# Patient Record
Sex: Female | Born: 1965 | Race: White | Hispanic: No | Marital: Married | Smoking: Never smoker
Health system: Southern US, Community
[De-identification: ages and names within clinical notes are randomized; demographics above are authoritative.]

## PROBLEM LIST (undated history)

## (undated) DIAGNOSIS — N63 Unspecified lump in unspecified breast: Secondary | ICD-10-CM

## (undated) DIAGNOSIS — T7840XA Allergy, unspecified, initial encounter: Secondary | ICD-10-CM

## (undated) DIAGNOSIS — J45909 Unspecified asthma, uncomplicated: Secondary | ICD-10-CM

## (undated) HISTORY — DX: Allergy, unspecified, initial encounter: T78.40XA

## (undated) HISTORY — DX: Unspecified asthma, uncomplicated: J45.909

---

## 1995-05-20 ENCOUNTER — Encounter: Payer: Self-pay | Admitting: Gastroenterology

## 1995-05-30 ENCOUNTER — Encounter: Payer: Self-pay | Admitting: Gastroenterology

## 1996-03-01 ENCOUNTER — Encounter: Payer: Self-pay | Admitting: Gastroenterology

## 1996-04-23 ENCOUNTER — Encounter: Payer: Self-pay | Admitting: Gastroenterology

## 1997-10-22 ENCOUNTER — Inpatient Hospital Stay (HOSPITAL_COMMUNITY): Admission: AD | Admit: 1997-10-22 | Discharge: 1997-10-24 | Payer: Self-pay | Admitting: Obstetrics & Gynecology

## 1997-10-26 ENCOUNTER — Inpatient Hospital Stay (HOSPITAL_COMMUNITY): Admission: AD | Admit: 1997-10-26 | Discharge: 1997-10-31 | Payer: Self-pay | Admitting: Obstetrics & Gynecology

## 1997-10-30 ENCOUNTER — Encounter: Admission: RE | Admit: 1997-10-30 | Discharge: 1998-01-28 | Payer: Self-pay | Admitting: Obstetrics & Gynecology

## 1999-04-02 ENCOUNTER — Other Ambulatory Visit: Admission: RE | Admit: 1999-04-02 | Discharge: 1999-04-02 | Payer: Self-pay | Admitting: Obstetrics & Gynecology

## 2000-04-15 ENCOUNTER — Other Ambulatory Visit: Admission: RE | Admit: 2000-04-15 | Discharge: 2000-04-15 | Payer: Self-pay | Admitting: Obstetrics & Gynecology

## 2000-11-10 ENCOUNTER — Encounter: Payer: Self-pay | Admitting: Obstetrics and Gynecology

## 2000-11-10 ENCOUNTER — Encounter (INDEPENDENT_AMBULATORY_CARE_PROVIDER_SITE_OTHER): Payer: Self-pay

## 2000-11-10 ENCOUNTER — Observation Stay (HOSPITAL_COMMUNITY): Admission: AD | Admit: 2000-11-10 | Discharge: 2000-11-11 | Payer: Self-pay | Admitting: Obstetrics and Gynecology

## 2000-11-11 ENCOUNTER — Encounter: Payer: Self-pay | Admitting: Obstetrics and Gynecology

## 2001-06-22 ENCOUNTER — Other Ambulatory Visit: Admission: RE | Admit: 2001-06-22 | Discharge: 2001-06-22 | Payer: Self-pay | Admitting: Obstetrics & Gynecology

## 2001-12-21 ENCOUNTER — Inpatient Hospital Stay (HOSPITAL_COMMUNITY): Admission: AD | Admit: 2001-12-21 | Discharge: 2001-12-24 | Payer: Self-pay | Admitting: Obstetrics & Gynecology

## 2001-12-21 ENCOUNTER — Encounter (INDEPENDENT_AMBULATORY_CARE_PROVIDER_SITE_OTHER): Payer: Self-pay | Admitting: Specialist

## 2001-12-25 ENCOUNTER — Encounter: Admission: RE | Admit: 2001-12-25 | Discharge: 2002-01-24 | Payer: Self-pay | Admitting: Family Medicine

## 2002-02-12 ENCOUNTER — Other Ambulatory Visit: Admission: RE | Admit: 2002-02-12 | Discharge: 2002-02-12 | Payer: Self-pay | Admitting: Obstetrics & Gynecology

## 2002-07-26 ENCOUNTER — Encounter: Payer: Self-pay | Admitting: Gastroenterology

## 2002-08-22 ENCOUNTER — Encounter: Payer: Self-pay | Admitting: Gastroenterology

## 2002-08-25 ENCOUNTER — Encounter: Payer: Self-pay | Admitting: Gastroenterology

## 2002-09-28 ENCOUNTER — Encounter: Payer: Self-pay | Admitting: Gastroenterology

## 2003-03-20 ENCOUNTER — Other Ambulatory Visit: Admission: RE | Admit: 2003-03-20 | Discharge: 2003-03-20 | Payer: Self-pay | Admitting: Obstetrics & Gynecology

## 2004-03-24 ENCOUNTER — Other Ambulatory Visit: Admission: RE | Admit: 2004-03-24 | Discharge: 2004-03-24 | Payer: Self-pay | Admitting: Obstetrics & Gynecology

## 2004-08-03 ENCOUNTER — Encounter: Payer: Self-pay | Admitting: Gastroenterology

## 2004-09-04 ENCOUNTER — Encounter: Payer: Self-pay | Admitting: Gastroenterology

## 2005-04-13 ENCOUNTER — Other Ambulatory Visit: Admission: RE | Admit: 2005-04-13 | Discharge: 2005-04-13 | Payer: Self-pay | Admitting: Obstetrics & Gynecology

## 2006-08-26 ENCOUNTER — Ambulatory Visit: Payer: Self-pay | Admitting: Gastroenterology

## 2006-08-26 LAB — CONVERTED CEMR LAB
ALT: 15 units/L (ref 0–40)
AST: 16 units/L (ref 0–37)
Albumin: 4.3 g/dL (ref 3.5–5.2)
Alkaline Phosphatase: 37 units/L — ABNORMAL LOW (ref 39–117)
Basophils Absolute: 0.1 10*3/uL (ref 0.0–0.1)
Basophils Relative: 1.3 % — ABNORMAL HIGH (ref 0.0–1.0)
Bilirubin, Direct: 0.2 mg/dL (ref 0.0–0.3)
Eosinophils Absolute: 0.1 10*3/uL (ref 0.0–0.6)
Eosinophils Relative: 3.1 % (ref 0.0–5.0)
HCT: 37.3 % (ref 36.0–46.0)
Hemoglobin: 13 g/dL (ref 12.0–15.0)
Lymphocytes Relative: 28.2 % (ref 12.0–46.0)
MCHC: 34.7 g/dL (ref 30.0–36.0)
MCV: 97.5 fL (ref 78.0–100.0)
Monocytes Absolute: 0.3 10*3/uL (ref 0.2–0.7)
Monocytes Relative: 7.4 % (ref 3.0–11.0)
Neutro Abs: 2.6 10*3/uL (ref 1.4–7.7)
Neutrophils Relative %: 60 % (ref 43.0–77.0)
Platelets: 230 10*3/uL (ref 150–400)
RBC: 3.83 M/uL — ABNORMAL LOW (ref 3.87–5.11)
RDW: 11.2 % — ABNORMAL LOW (ref 11.5–14.6)
TSH: 1.03 microintl units/mL (ref 0.35–5.50)
Total Bilirubin: 0.9 mg/dL (ref 0.3–1.2)
Total Protein: 7.3 g/dL (ref 6.0–8.3)
WBC: 4.3 10*3/uL — ABNORMAL LOW (ref 4.5–10.5)

## 2006-09-02 ENCOUNTER — Encounter: Payer: Self-pay | Admitting: Gastroenterology

## 2006-09-02 ENCOUNTER — Encounter (INDEPENDENT_AMBULATORY_CARE_PROVIDER_SITE_OTHER): Payer: Self-pay | Admitting: *Deleted

## 2006-09-02 ENCOUNTER — Ambulatory Visit: Payer: Self-pay | Admitting: Gastroenterology

## 2009-01-15 ENCOUNTER — Telehealth: Payer: Self-pay | Admitting: Gastroenterology

## 2009-09-25 ENCOUNTER — Encounter (INDEPENDENT_AMBULATORY_CARE_PROVIDER_SITE_OTHER): Payer: Self-pay | Admitting: *Deleted

## 2009-09-25 ENCOUNTER — Telehealth: Payer: Self-pay | Admitting: Gastroenterology

## 2009-10-15 ENCOUNTER — Telehealth: Payer: Self-pay | Admitting: Gastroenterology

## 2009-11-04 ENCOUNTER — Ambulatory Visit: Payer: Self-pay | Admitting: Gastroenterology

## 2009-11-04 DIAGNOSIS — R197 Diarrhea, unspecified: Secondary | ICD-10-CM

## 2009-11-04 DIAGNOSIS — K529 Noninfective gastroenteritis and colitis, unspecified: Secondary | ICD-10-CM | POA: Insufficient documentation

## 2009-11-05 LAB — CONVERTED CEMR LAB
BUN: 10 mg/dL (ref 6–23)
Basophils Absolute: 0 10*3/uL (ref 0.0–0.1)
Basophils Relative: 0.6 % (ref 0.0–3.0)
CO2: 32 meq/L (ref 19–32)
Calcium: 9.1 mg/dL (ref 8.4–10.5)
Chloride: 104 meq/L (ref 96–112)
Creatinine, Ser: 0.7 mg/dL (ref 0.4–1.2)
Eosinophils Absolute: 0.1 10*3/uL (ref 0.0–0.7)
Eosinophils Relative: 3.1 % (ref 0.0–5.0)
GFR calc non Af Amer: 97.12 mL/min (ref >60)
Glucose, Bld: 90 mg/dL (ref 70–99)
HCT: 36.5 % (ref 36.0–46.0)
Hemoglobin: 12.7 g/dL (ref 12.0–15.0)
Lymphocytes Relative: 24.7 % (ref 12.0–46.0)
Lymphs Abs: 1.1 10*3/uL (ref 0.7–4.0)
MCHC: 34.9 g/dL (ref 30.0–36.0)
MCV: 99.4 fL (ref 78.0–100.0)
Monocytes Absolute: 0.3 10*3/uL (ref 0.1–1.0)
Monocytes Relative: 7.7 % (ref 3.0–12.0)
Neutro Abs: 2.8 10*3/uL (ref 1.4–7.7)
Neutrophils Relative %: 63.9 % (ref 43.0–77.0)
Platelets: 219 10*3/uL (ref 150.0–400.0)
Potassium: 3.9 meq/L (ref 3.5–5.1)
RBC: 3.67 M/uL — ABNORMAL LOW (ref 3.87–5.11)
RDW: 12.7 % (ref 11.5–14.6)
Sodium: 141 meq/L (ref 135–145)
WBC: 4.3 10*3/uL — ABNORMAL LOW (ref 4.5–10.5)

## 2009-11-12 LAB — CONVERTED CEMR LAB: Tissue Transglutaminase Ab, IgA: 3.2 units (ref <20)

## 2009-11-26 ENCOUNTER — Telehealth: Payer: Self-pay | Admitting: Gastroenterology

## 2009-12-08 ENCOUNTER — Telehealth (INDEPENDENT_AMBULATORY_CARE_PROVIDER_SITE_OTHER): Payer: Self-pay | Admitting: *Deleted

## 2009-12-17 ENCOUNTER — Telehealth (INDEPENDENT_AMBULATORY_CARE_PROVIDER_SITE_OTHER): Payer: Self-pay | Admitting: *Deleted

## 2010-01-12 ENCOUNTER — Encounter (INDEPENDENT_AMBULATORY_CARE_PROVIDER_SITE_OTHER): Payer: Self-pay | Admitting: *Deleted

## 2010-08-06 NOTE — Progress Notes (Signed)
Summary: Talk to nurse  Phone Note Call from Patient Call back at [phone]   Call For: Dr Christella Hartigan Reason for Call: Talk to Nurse Summary of Call: Easier for her to tell nurse directly what she wants instead of telling me. Initial call taken by: Leanor Kail [facility],  Nov 26, 2009 1:52 PM  Follow-up for Phone Call        Pt. was told to go back on gluten again for 5 weeks and come back for re-testing By the third day on gluten her symptoms re-occurred so is asking what the next step is? Wondering if  an endoscopy is  indicated at this point? Follow-up by: Teryl Lucy RN,  Nov 26, 2009 3:12 PM  Additional Follow-up for Phone Call Additional follow up Details #1::        really would like her to be on gluten another 10 days, then lets go ahead with EGD, biopsy.  Will have her get another celiac sprue panel the day prior to her endo Additional Follow-up by: Rachael Fee MD,  Nov 26, 2009 8:26 PM    Additional Follow-up for Phone Call Additional follow up Details #2::    Pt. ntfd. of Dr.Jacobs orders and she will call back to schedule to schedule EGD. Follow-up by: Teryl Lucy RN,  Nov 27, 2009 10:38 AM

## 2010-08-06 NOTE — Progress Notes (Signed)
  Phone Note Other Incoming   Request: Send information Summary of Call: Records received from Dr. Carman Ching. 19 pages of records forwarded to Dr. Christella Hartigan for review.

## 2010-08-06 NOTE — Progress Notes (Signed)
Summary: Triage  Phone Note Call from Patient Call back at [phone] Cell   Caller: Patient Call For: Dr. Christella Hartigan Reason for Call: Talk to Nurse Summary of Call: Pt has an appt. on 10-31-09. Wants to come in prior to appt. to have labs done to be checked for Celiacs disease --family hx Initial call taken by: Karna Christmas,  September 25, 2009 3:50 PM  Follow-up for Phone Call        again explained that the pt would need to be seen first in the office before test can be ordered. Follow-up by: Chales Abrahams CMA Duncan Dull),  September 25, 2009 4:02 PM

## 2010-08-06 NOTE — Letter (Signed)
Summary: New Patient letter  [facility]  [address] [place], [place] [address]   Phone: [phone]  Fax: [phone]       09/25/2009 MRN: [id number]  Kelly Bullock [address] [place], [place]  [address]  Dear Kelly Bullock,  Welcome to the Gastroenterology Division at [facility].    You are scheduled to see Dr. Christella Hartigan on 10-31-09 at 3:00p.m. on the 3rd floor at [facility], [address]  We ask that you try to arrive at our office 15 minutes prior to your appointment time to allow for check-in.  We would like you to complete the enclosed self-administered evaluation form prior to your visit and bring it with you on the day of your appointment.  We will review it with you.  Also, please bring a complete list of all your medications or, if you prefer, bring the medication bottles and we will list them.  Please bring your insurance card so that we may make a copy of it.  If your insurance requires a referral to see a specialist, please bring your referral form from your primary care physician.  Co-payments are due at the time of your visit and may be paid by cash, check or credit card.     Your office visit will consist of a consult with your physician (includes a physical exam), any laboratory testing he/she may order, scheduling of any necessary diagnostic testing (e.g. x-ray, ultrasound, CT-scan), and scheduling of a procedure (e.g. Endoscopy, Colonoscopy) if required.  Please allow enough time on your schedule to allow for any/all of these possibilities.    If you cannot keep your appointment, please call [phone] to cancel or reschedule prior to your appointment date.  This allows [place] the opportunity to schedule an appointment for another patient in need of care.  If you do not cancel or reschedule by 5 p.m. the business day prior to your appointment date, you will be charged a $50.00 late cancellation/no-show fee.    Thank you for choosing   Gastroenterology for your medical needs.  We appreciate the opportunity to care for you.  Please visit [place] at our website  to learn more about our practice.                     Sincerely,                                                             The Gastroenterology Division

## 2010-08-06 NOTE — Letter (Signed)
Summary: [facility]  [facility]   Imported By: Sherian Rein 12/11/2009 08:33:09  _____________________________________________________________________  External Attachment:    Type:   Image     Comment:   External Document

## 2010-08-06 NOTE — Procedures (Signed)
Summary: Colonoscopy/[facility]  Colonoscopy/[facility]   Imported By: Sherian Rein 12/11/2009 08:37:28  _____________________________________________________________________  External Attachment:    Type:   Image     Comment:   External Document

## 2010-08-06 NOTE — Progress Notes (Signed)
Summary: lab reminder  Phone Note Outgoing Call Call back at [facility] Phone [phone] Call back at Work Phone [phone]   Call placed by: Chales Abrahams CMA Duncan Dull),  December 17, 2009 9:37 AM Summary of Call: called to remind pt to have labs drawn . Initial call taken by: Chales Abrahams CMA Duncan Dull),  December 17, 2009 9:38 AM     Appended Document: lab reminder refaxed records to Carman Ching for review by Dr Christella Hartigan.  See 11/26/09  phone note.  Dr Christella Hartigan to review and make a descision on EGD or Colon and need for further labs.  Appended Document: lab reminder records on your desk for review  Appended Document: lab reminder reviewed colonoscopy in 2004 by Dr. Randa Evens.  This was normal, however biopsies showed lymphocytic colitis.  She told me in office that she didn't respond to budesonide. The last note I see from Dr. Heide Guile (09/2002) shows that he tried asacol (stopped due to bloating) and he was putting her on questran trial.    she should continue gluten containing foods another 1-2 weeks, then repeat the celiac sprue panel.  Appended Document: lab reminder also will need total IgA done at same time as sprue panel.  Appended Document: lab reminder left message on machine to call back   Appended Document: lab reminder pt will call when she has been on Gluten for another week or two and I will schedule the labs at that time  Appended Document: lab reminder left message on machine to call back   Appended Document: lab reminder left message on machine to call back

## 2010-08-06 NOTE — Letter (Signed)
Summary: [facility]  [facility]   Imported By: Sherian Rein 12/11/2009 08:38:54  _____________________________________________________________________  External Attachment:    Type:   Image     Comment:   External Document

## 2010-08-06 NOTE — Assessment & Plan Note (Signed)
History of Present Illness Visit Type: Initial Visit Primary GI MD: Rob Bunting MD Primary Provider: n/a Chief Complaint: chronic diarrhea History of Present Illness:     very pleasant 45 year old woman whom I last saw here in 2007.  who had NO epigastric issues.  She has had lower abdominal cramping, worse when socializing.  She quit beer (was definitaly making lower abd cramps much worse).    Family member had been recently diagnosed with celiac Sprue.  It sounds as if they were diagnosed either by biopsy.  She has always had loose stools.  Oily sheen on stool+.  She will usually have 5 stools a day.    She is Engineer, maintenance (IT).    she has been avoiding gluten for the past 6 weeks.  The cramps are gone, but she still has loose stools.  She has had fecal incontinence every month or two.  she had a colonoscopy in 2004 by Dr. Randa Evens, random biopsies were taken and these showed lymphocytic colitis. She has tried budesonide without any improvement in her symptoms.           Current Medications (verified): 1)  Zyrtec Allergy 10 Mg Tabs (Cetirizine Hcl) .... Once Daily  Allergies (verified): No Known Drug Allergies  Past History:  Past Medical History: lymphocytic colitis on colonoscopy biopsies 2004 Intermittent anemia  Past Surgical History: C-sections  Family History: celiac sprue Grandmother had colon cancer Cousin with Crohn's  Social History: she is married, she has 3 children, she works as a Paramedic, she quit smoking, she drinks 2 alcoholic beverages per week, she drinks 4-5 caffeinated beverages per day  Review of Systems       Pertinent positive and negative review of systems were noted in the above HPI and GI specific review of systems.  All other review of systems was otherwise negative.   Vital Signs:  Patient profile:   45 year old female Height:      127 inches Weight:      64 pounds BMI:     2.80 Pulse rate:   64 / minute Pulse  rhythm:   regular BP sitting:   92 / 60  (left arm) Cuff size:   regular  Vitals Entered By: June McMurray CMA Duncan Dull) (Nov 04, 2009 10:22 AM)  Physical Exam  Additional Exam:  Constitutional: generally well appearing Psychiatric: alert and oriented times 3 Eyes: extraocular movements intact Mouth: oropharynx moist, no lesions Neck: supple, no lymphadenopathy Cardiovascular: heart regular rate and rythm Lungs: CTA bilaterally Abdomen: soft, non-tender, non-distended, no obvious ascites, no peritoneal signs, normal bowel sounds Extremities: no lower extremity edema bilaterally Skin: no lesions on visible extremities    Impression & Recommendations:  Problem # 1:  chronic diarrhea, intermittent cramping she has a family history of celiac sprue and perhaps that is what is causing some of her symptoms. We will do celiac sprue panel today. She will see a basic metabolic profile today. She had lymphocytic colitis based on biopsies done from her colon in 2004 by Dr. Randa Evens. That could certainly be causing her symptoms however she did not respond to budesonide. Possibly she has both of these diseases. 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 She has been gluten free for 6 weeks. If her celiac sprue panel today is negative then I will put her back on gluten in her diet and recheck panel in 2-3 weeks. It at that time she is still negative then I think repeating colonoscopy and upper endoscopy  is in order.  I explained to her that 4-5 caffeinated beverages per day can certainly contribute to her loose stools I recommended that she cut back.  Other Orders: TLB-CBC Platelet - w/Differential (85025-CBCD) TLB-BMP (Basic Metabolic Panel-BMET) (80048-METABOL) T-Sprue Panel (Celiac Disease Aby Eval) (83516x3/[address])  Patient Instructions: 1)  You will get lab test(s) done today (celiac sprue panel, cbc, bmet). 2)  Will determine your need for colonoscopy or repeat EGD pending the sprue  testing. 3)  We will get records from Dr. Carman Ching colonoscopy/pathology. 4)  The medication list was reviewed and reconciled.  All changed / newly prescribed medications were explained.  A complete medication list was provided to the patient / caregiver.

## 2010-08-06 NOTE — Letter (Signed)
Summary: [facility]  [facility]   Imported By: Sherian Rein 12/11/2009 08:16:15  _____________________________________________________________________  External Attachment:    Type:   Image     Comment:   External Document

## 2010-08-06 NOTE — Letter (Signed)
Summary: [facility]   Imported By: Sherian Rein 12/11/2009 08:23:23  _____________________________________________________________________  External Attachment:    Type:   Image     Comment:   External Document

## 2010-08-06 NOTE — Letter (Signed)
Summary: [facility]  [facility]   Imported By: Sherian Rein 12/11/2009 08:32:09  _____________________________________________________________________  External Attachment:    Type:   Image     Comment:   External Document

## 2010-08-06 NOTE — Progress Notes (Signed)
Summary: triage  Phone Note Call from Patient Call back at cell [phone]   Caller: Patient Call For: Christella Hartigan Reason for Call: Talk to Nurse Summary of Call: Patient wants to know if she can be sooner than next availble appt 5-10 because her symptoms are worse and she has been waiting to see him and was bumped for 4-29 Initial call taken by: Tawni Levy,  October 15, 2009 3:28 PM  Follow-up for Phone Call        left message on machine to call back Chales Abrahams CMA Duncan Dull)  October 15, 2009 3:41 PM   Pt was scheduled for 11/04/09.  She is aware Follow-up by: Chales Abrahams CMA Duncan Dull),  October 16, 2009 8:29 AM

## 2010-08-06 NOTE — Letter (Signed)
Summary: [facility]  [facility]   Imported By: Sherian Rein 12/11/2009 08:34:05  _____________________________________________________________________  External Attachment:    Type:   Image     Comment:   External Document

## 2010-08-06 NOTE — Letter (Signed)
Summary: [facility]  [facility]   Imported By: Sherian Rein 12/11/2009 08:35:30  _____________________________________________________________________  External Attachment:    Type:   Image     Comment:   External Document

## 2010-08-06 NOTE — Letter (Signed)
Summary: Idell Pickles MD  Idell Pickles MD   Imported By: Sherian Rein 12/11/2009 08:22:10  _____________________________________________________________________  External Attachment:    Type:   Image     Comment:   External Document

## 2010-08-06 NOTE — Letter (Signed)
Summary: Appointment Reminder  [facility]  [address] [place], [place] [address]   Phone: [phone]  Fax: [phone]        January 12, 2010 MRN: [id number]    [facility] [address] [place], [place]  [address]    Dear Ms. Germano,   We have been unable to reach you by phone to schedule a lab follow up   appointment that was recommended for you by Dr. Christella Hartigan. It is very   important that we reach you to schedule this appointment. We hope you  allow [place] to participate in your health care needs. Please contact us at  [phone] at your earliest convenience to schedule your appointment.     Sincerely,    Chales Abrahams CMA (AAMA)  Appended Document: Appointment Reminder letter mailed

## 2010-08-06 NOTE — Procedures (Signed)
Summary: EGD   EGD  Procedure date:  09/02/2006  Findings:      Location: [facility]   Patient Name: Kelly Bullock, Kelly Bullock. MRN:  Procedure Procedures: Panendoscopy (EGD) CPT: 43235.    with biopsy(s)/brushing(s). CPT: D1846139.  Personnel: Endoscopist: Rachael Fee, MD.  Exam Location: Exam performed in Endoscopy Suite. Outpatient  Patient Consent: Procedure, Alternatives, Risks and Benefits discussed, consent obtained, from patient. Consent was obtained by the RN.  Indications Symptoms: Dyspepsia, Reflux symptoms  History  Current Medications: Patient is not currently taking Coumadin.  Comments: Patient history reviewed and updated, pre-procedure physical performed prior to initiation of sedation? yes Pre-Exam Physical: Performed Sep 02, 2006  Cardio-pulmonary exam, Abdominal exam, Mental status exam WNL.  Exam Exam Info: Maximum depth of insertion Duodenum, intended Duodenum. Gastric retroflexion performed. Images taken. ASA Classification: II. Tolerance: good.  Sedation Meds: Patient assessed and found to be appropriate for moderate (conscious) sedation. Fentanyl 50 mcg. given IV. Versed 6 mg. given IV.  Monitoring: BP and pulse monitoring done. Oximetry used. Supplemental O2 given  Findings - Normal: Proximal Esophagus to Duodenal 2nd Portion. Comments: otherwise normal examination.  OTHER FINDING: 1cm tongue of salmon colored mucosa above an otherwise normal GE junction at 37cm from incisors.  No nodularity. in Distal Esophagus. Biopsy/Other Finding taken. Path # 1.   Assessment Abnormal examination, see findings above.  Comments: Non-nodular Barrett's appearing mucosa above an otherwise normal GE junction. Events  Unplanned Intervention: No unplanned interventions were required.  Unplanned Events: There were no complications. Plans Comments: Continue once daily PPI (OTC prilosec) taken 20-30 minutes prior to breakfast.  If Barrett's  is confirmed on biopsies, will need routine surveillance. Scheduling: Await pathology to schedule patient.  This report was created from the original endoscopy report, which was reviewed and signed by the above listed endoscopist.

## 2010-08-06 NOTE — Letter (Signed)
Summary: [facility]  [facility]   Imported By: Sherian Rein 12/11/2009 08:31:05  _____________________________________________________________________  External Attachment:    Type:   Image     Comment:   External Document

## 2010-11-20 NOTE — Op Note (Signed)
[facility]  Patient:    Kelly Bullock, Kelly Bullock                     MRN: [id number] Proc. Date: 11/11/00 Adm. Date:  [id number] Attending:  Frederich Balding                           Operative Report  PREOPERATIVE DIAGNOSIS:       Incomplete abortion at approximately 10 weeks.  POSTOPERATIVE DIAGNOSIS:      Incomplete abortion at approximately 10 weeks.  PROCEDURE:                    Dilation and evacuation.  SURGEON:                      Trevor Iha, M.D.  ANESTHESIA:                   Monitored anesthetic care and paracervical                               block.  ESTIMATED BLOOD LOSS:         20 cc.  INDICATIONS:                  [name] is a 45 year old white female who presented this morning with vaginal bleeding. Ultrasound showed a viable fetus. She did indeed have bright red bleeding. She was placed in the hospital for pain relief, threatened miscarriage. This afternoon, bleeding had subsided, performed ultrasound, and at this time, saw embryonic demise and some tissue within the cervix; however, a large amount of tissue still within the uterus. The patient ______ for dilation and evacuation. Risks and benefits were discussed at length. Informed consent was obtained. The patient is Rh negative but her husband is as well.  DESCRIPTION OF PROCEDURE:     After adequate analgesia, the patient was placed in the dorsal lithotomy position. She was sterilely prepped and draped. The bladder was sterilely drained. Graves speculum was placed. Xylocaine with 1:100,000 epinephrine was injected into the anterior lip of the cervix, and also at 5 and 7 oclock. Paracervical block is placed. The uterus is sounded to 11 cm. Hegar dilator of 31 was easily inserted. An 8 mm suction curet was inserted and products of conception were retrieved. This was performed until a gritty surface was felt throughout the endometrial cavity, and no more products  were retrieved at this time. It was felt the endometrial cavity was empty, curet was easily removed. The patient was given Methergine 0.2 mg IM with good response and minimal bleeding. At this time, the tenaculum was removed from the anterior lip of the cervix, noted to be hemostatic. The speculum was then removed. The patient was stable and transferred to recovery room. The sponge and instrument count was normal x 3. Estimated blood loss during the procedure was 20 cc.  DISPOSITION:                  The patient was discharged home with followup in the office in two to three weeks. She was given a routine instruction sheet for D&C, and a prescription for Methergine 0.2 mg p.o. to take every eight hours, and doxycycline 100 mg p.o. b.i.d. x 7 days. DD:  11/11/00 TD:  11/12/00 Job: [id number]

## 2010-11-20 NOTE — H&P (Signed)
[facility]  Patient:    CECILEE, ROSNER Visit Number: [id number] MRN: [id number]          Service Type: OBS Location: [facility] Attending Physician:  Minette Headland Dictated by:   Freddy Finner, M.D. Adm. Date:  09/20/01                           History and Physical  ADMITTING DIAGNOSES:          1. Intrauterine pregnancy 36 4/[redacted] weeks                                  gestation.                               2. Mild recent onset of pregnancy induced                                  hypertension.                               3. History of severe pregnancy induced                                  hypertension with previous pregnancy.                               4. Surgically scarred uterus.  PLAN:                         Cesarean delivery.  HISTORY OF PRESENT ILLNESS:   The patient is a 45 year old white married female, gravida 3, para 1 with two living children from her twin pregnancy. She is now 36 4/[redacted] weeks gestation by early exam by pregnancy with in vitro fertilization. She is known to have a history of severe PIH with delivery at 28 weeks with her twins. In the last 10-14 days, she has had slight increase in blood pressure, a trace of hematuria, and excessive peripheral edema. She has had TIH panels which are showing a slight increase in alkaline phosphatase but no other abnormalities to this point. She was initially scheduled for delivery on June 25 but because of her mild pregnancy induced hypertension and the fact that she is now almost [redacted] weeks gestation, it is elected to proceed earlier with delivery and she is admitted now for repeat cesarean delivery.  REVIEW OF SYSTEMS:  Her current review no systems not cardiopulmonary, GI, or GU symptoms. She has no CNS symptoms.  PAST MEDICAL HISTORY:         Recorded in the prenatal summary and will not be repeated.  PHYSICAL EXAMINATION:  HEENT:                        Grossly within  normal limits. Thyroid gland is not palpably enlarged.  VITAL SIGNS:                  Blood pressure in the office on the day prior to admission was 124/78. Weight  was 170.  Deep tendon reflexes are +2. There is no clonus. There was a trace of proteinuria.  CHEST:                        Clear to auscultation.  HEART:                        Normal sinus rhythm without murmur, gallop or rub.  ABDOMEN:                      Gravid. Estimated fetal weight of 6 pounds.  CERVIX:                       Closed, 50% effaced, vertex is in the -3.  EXTREMITIES:                  +3 edema to the knee.  ASSESSMENT:                   Intrauterine pregnancy 36 1/[redacted] weeks gestation, early pregnancy induced hypertension, history of severe pregnancy induced hypertension with previous pregnancy.  PLAN:                         Repeat cesarean delivery. Dictated by:   Freddy Finner, M.D. Attending Physician:  Minette Headland DD:  12/21/01 TD:  12/21/01 Job: [id number]

## 2010-11-20 NOTE — Op Note (Signed)
[facility]  Patient:    Kelly Bullock, Kelly Bullock Visit Number: [id number] MRN: [id number]          Service Type: OBS Location: 910A 9107 01 Attending Physician:  Minette Headland Dictated by:   Freddy Finner, M.D. Proc. Date: 12/21/01 Admit Date:  12/21/2001 Discharge Date: 12/24/2001                             Operative Report  PREOPERATIVE DIAGNOSES:       1. Intrauterine pregnancy at 36-4/[redacted] weeks                                  gestation.                               2. Pregnancy-induced hypertension.                               3. Surgically-scarred uterus.                               4. Multiparity, request for surgical                                  sterilization.  POSTOPERATIVE DIAGNOSES:      1. Intrauterine pregnancy at 36-4/[redacted] weeks                                  gestation, with delivery of a viable female                                  infant, Apgars of 8 and 9, cord pH 7.34.                               2. Pregnancy-induced hypertension.                               3. Surgically-scarred uterus.                               4. Multiparity, request for surgical                                  sterilization.  SURGEON:                      Freddy Finner, M.D.  ANESTHESIA:                   Spinal.  COMPLICATIONS:                None.  DETAILS OF PRESENT ILLNESS:   Reported in the admission note.  The patient is admitted on the day of surgery.  DESCRIPTION OF PROCEDURE:     She was  brought to the operating room and there placed under adequate spinal anesthesia, placed in the dorsal recumbent position with elevation of the right hip of about 15 degrees.  Abdomen was prepped and draped in the usual fashion and a Foley catheter placed using sterile technique.  A lower abdominal transverse incision was made through an old scar and carried sharply down to the fascia.  The fascia was entered sharply and extended to the  extent of the skin incision.  The rectus sheath was developed superiorly and inferiorly with blunt and sharp dissection. Rectus muscles were divided bluntly.  The peritoneum was entered sharply and extended bluntly to the extent of the skin incision.  A transverse incision was made in the visceral peritoneum along the lower uterine segment using a bladder blade to dissect out the lower segment.  A transverse incision was made in the lower segment, extending bluntly in a transverse direction.  The fluid was clear.  A viable infant was then delivered without difficulty. Arterial cord blood was obtained for gases, routine venous samples taken.  The placenta and other products of conception removed from the uterus.  This was confirmed by manual exploration of the uterine cavity.  The uterus was delivered through the abdominal wall incision.  The uterus, tubes, and ovaries were normal.  The uterine incision was closed in a single layer with a running locking 0 Monocryl.  _____ was required for monitoring for complete hemostasis.  The bladder flap was reapproximated with an uninterrupted figure-of-eight of 0 Monocryl in the midline.  The distal portion of each tube was then elevated with a Babcock, doubly ligated with 0 plain ties and ____ excised and the ______ bleeding ______ and then fulgurated with the Bovie. The uterus was placed back into the abdominal cavity.  Irrigation was carried out. Hemostasis was complete.  Pack, needle, and instrument counts were correct.  The abdominal incision was closed in layers with running 0 Monocryl to close the peritoneum and reapproximate the rectus muscles, the fascia was closed with running 0 PDS, subcu was closed with running 2-0 plain, the skin was closed with wide skin staples and 1/4-inch Steri-Strips.  The patient tolerated the operative procedure well, was taken to the recovery room in good condition. Dictated by:   Freddy Finner, M.D. Attending  Physician:  Minette Headland DD:  12/22/01 TD:  12/22/01 Job: [id number]

## 2010-11-20 NOTE — Discharge Summary (Signed)
[facility] of [facility]  Patient:    Kelly Bullock, Kelly Bullock Visit Number: [id number] MRN: [id number]          Service Type: OBS Location: 910A 9107 01 Attending Physician:  Minette Headland Dictated by:   Julio Sicks, N.P. Admit Date:  12/21/2001 Discharge Date: 12/24/2001                             Discharge Summary  ADMISSION DIAGNOSES:          1. Intrauterine pregnancy at 36-4/7 weeks.                               2. Mild recent onset of pregnancy-induced                                  hypertension.                               3. History of severe pregnancy-induced                                  hypertension with previous pregnancy.                               4. Surgically scarred uterus.                               5. Multiparity, request surgical sterilization.  DISCHARGE DIAGNOSES:          1. Status post cesarean delivery.                               2. Permanent sterilization.                               3. Viable female infant.  PROCEDURES:                   1. Repeat low transverse cesarean section.                               2. Bilateral tubal ligation.  REASON FOR ADMISSION:         Please see the written H&P.  HOSPITAL COURSE:              The patient was admitted to the [facility] at 36-4/7 weeks for a repeat cesarean delivery.  The patient had recently developed a slight increase in blood pressure, trace proteinuria, and extensive peripheral edema.  The patient had a history of severe pregnancy-induced hypertension with a previous pregnancy.  The decision was made to proceed with repeat cesarean delivery.  The patient was taken to the operating room where spinal anesthesia was administered without difficulty.  A low transverse incision was made with delivery of a viable female infant weighing 8 pounds 4 ounces with Apgars of 8 at one minute and 9 at five minutes.  The umbilical  cord pH was 7.34.   Bilateral tubal ligation was performed.  The patient tolerated the procedure well and was taken to the recovery room in stable condition.  On postoperative day #1, the vital signs were stable with a blood pressure of 110/72-130/80.  The patient had adequate return of bowel sounds.  The patient was tolerating a clear liquid diet without complaints of nausea or vomiting.  The Foley was discontinued and the patient was voiding without difficulty.  The abdomen was soft.  The abdominal dressing was clean, dry, and intact.  Labs revealed a hemoglobin of 10.7, hematocrit 30.6, and WBC count 8.9.  On postoperative day #2, the patient was tolerating a regular diet without complaints.  She was ambulating without assistance.  Later in the evening, the patient did develop a mild increase in blood pressure to 140/90-170/100.  Deep tendon reflexes were 2+ bilaterally with one to two beats of clonus noted per nurse assessment.  The patient denied headache.  There was no change in 3+ peripheral edema.  On the following, day #3 postoperatively, the patient was without complaint of headache or visual changes.  The blood pressure was noted to be 112/64.  The incision was clean, dry, and intact.  The staples were removed.  DISPOSITION:                  The patient was discharged home.  CONDITION ON DISCHARGE:       Good.  DIET:                         Regular as tolerated.  ACTIVITY:                     No heavy lifting.  No driving x 2 weeks.  No vaginal entry.  FOLLOW-UP:                    The patient is to follow up in the office in one to two weeks for an incision check.  She is to call for a temperature greater than 100 degrees, persistent nausea and vomiting, heavy vaginal bleeding, and/or redness or drainage from the incision site.  DISCHARGE MEDICATIONS:        1. Tylox, #30, one to two every four to six                                  hours p.r.n. pain.                               2.  Motrin 600 mg every six hours p.r.n.                               3. Prenatal vitamins one p.o. daily. Dictated by:   Julio Sicks, N.P. Attending Physician:  Minette Headland DD:  01/18/02 TD:  01/23/02 Job: [id number]

## 2010-11-20 NOTE — Assessment & Plan Note (Signed)
[facility]                         GASTROENTEROLOGY OFFICE NOTE   NAME:Kelly Bullock, Kelly Bullock                     MRN:          [id number]  DATE:08/26/2006                            DOB:          05-12-1966    Patient is self-referred.   REASON FOR VISIT:  Dyspepsia, history of lymphocytic colitis.   HISTORY OF PRESENT ILLNESS:  Ms. Kelly Bullock is a pleasant 45 year old woman  who had problems with fecal incontinence and diarrhea 5-10 years ago.  This was eventually worked up by Dr. Vilinda Boehringer with colonoscopy in  February, 2004.  She had random biopsies performed, and these were  consistent with lymphocytic colitis.  She tried cholestyramine and Pepto-  Bismol, and nothing really seemed to help her out.  Currently, she moves  her bowels 3-4 times a day, usually fairly loose.  She has just learned  to deal with that.  She actually does not come in for her loose stools  today.  She is coming in now with complaints of dyspepsia, bloating, a  burning pressure in her upper epigastrium that has been going on for  over a month, and it happened intermittently throughout the past several  years.  She does not have true pyrosis or acid regurgitation.  She has  no dysphagia.  She describes the pain as occurring usually after eating.  She points to alcoholic beverages as sometimes being exacerbating,  caffeine as well.  She tried over-the-counter Prilosec and has noticed a  great improvement since beginning the PPI.  She has currently taken the  Prilosec 1-2 hours prior to her breakfast meal.   REVIEW OF SYSTEMS:  Notable for a 5 pound intentional weight loss in the  past two months.  She is on the [facility] Diet.  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   PAST MEDICAL HISTORY:  1. Chronic seasonal allergies, status post tubal ligation.  2. Lymphocytic colitis, as above.  Last colonoscopy in  February, 2004.   CURRENT MEDICATIONS:  OTC Prilosec and Allegra.   ALLERGIES:  No known drug allergies.   SOCIAL HISTORY:  Married with three sons.  Works as a Paramedic.  Clinical research associate.  Drinks alcoholic beverages about once a week.  She has 4-5  caffeinated beverages a day.   FAMILY HISTORY:  Paternal grandmother with colon cancer.  No other  colitis in the family.   PHYSICAL EXAMINATION:  VITAL SIGNS:  Height 5 feet 4 inches.  Weight 122  pounds.  Blood pressure 110/64.  Pulse 68.  CONSTITUTIONAL:  Generally well-appearing.  NEUROLOGIC:  Alert and oriented x3.  HEENT:  Extraocular movements are intact.  Oropharynx moist.  No  lesions.  NECK:  Supple.  No lymphadenopathy.  CARDIOVASCULAR:  Regular rate and rhythm.  LUNGS:  Clear to auscultation bilaterally.  ABDOMEN:  Soft, nontender, nondistended.  Normal bowel sounds.  EXTREMITIES:  No lower extremity edema.   ASSESSMENT/PLAN:  A 45 year old woman with likely acid-related  dyspepsia.  She has had symptoms off and on for many years.  These seem  to be more prominent lately.  I think we should proceed with EGD at her  soonest convenience.  I also will arrange for her to have labs done  today.  A CBC, complete metabolic profile, and thyroid testing.  She has  definitely noticed an improvement on OTC Prilosec, so I encouraged her  to continue this but recommended she take it 20-30 minutes prior to her  breakfast meals, that is the way the pills are designed to work most  effectively.  She is having less  troubles with her loose stools since  being on the Prilosec, which I cannot explain, but I am happy about  that.  If she has problems with dramatic loose stools again, I would  have to consider trying her on budesonide.  We will give her a GERD  handout as well.     Rachael Fee, MD  Electronically Signed    DPJ/MedQ  DD: 08/26/2006  DT: 08/26/2006  Job #: [id number]

## 2011-08-27 ENCOUNTER — Other Ambulatory Visit: Payer: Self-pay | Admitting: Obstetrics & Gynecology

## 2011-08-27 DIAGNOSIS — R928 Other abnormal and inconclusive findings on diagnostic imaging of breast: Secondary | ICD-10-CM

## 2011-09-07 ENCOUNTER — Other Ambulatory Visit: Payer: Self-pay | Admitting: Obstetrics & Gynecology

## 2011-09-07 ENCOUNTER — Ambulatory Visit
Admission: RE | Admit: 2011-09-07 | Discharge: 2011-09-07 | Disposition: A | Discharge status: Home / Self Care | Payer: Self-pay | Source: Ambulatory Visit | Attending: Obstetrics & Gynecology | Admitting: Obstetrics & Gynecology

## 2011-09-07 DIAGNOSIS — R928 Other abnormal and inconclusive findings on diagnostic imaging of breast: Secondary | ICD-10-CM

## 2012-08-30 ENCOUNTER — Other Ambulatory Visit: Payer: Self-pay | Admitting: Dermatology

## 2012-09-06 ENCOUNTER — Other Ambulatory Visit: Payer: Self-pay | Admitting: Obstetrics & Gynecology

## 2012-09-06 DIAGNOSIS — R921 Mammographic calcification found on diagnostic imaging of breast: Secondary | ICD-10-CM

## 2012-09-08 ENCOUNTER — Ambulatory Visit: Payer: BC Managed Care – PPO | Admitting: Cardiovascular Disease

## 2012-09-18 ENCOUNTER — Ambulatory Visit
Admission: RE | Admit: 2012-09-18 | Discharge: 2012-09-18 | Disposition: A | Discharge status: Home / Self Care | Payer: 59 | Source: Ambulatory Visit | Attending: Obstetrics & Gynecology | Admitting: Obstetrics & Gynecology

## 2012-09-18 DIAGNOSIS — R921 Mammographic calcification found on diagnostic imaging of breast: Secondary | ICD-10-CM

## 2013-06-20 ENCOUNTER — Other Ambulatory Visit: Payer: Self-pay

## 2013-06-20 DIAGNOSIS — Z1231 Encounter for screening mammogram for malignant neoplasm of breast: Secondary | ICD-10-CM

## 2013-09-11 ENCOUNTER — Other Ambulatory Visit: Payer: Self-pay | Admitting: Obstetrics & Gynecology

## 2013-09-11 DIAGNOSIS — N6310 Unspecified lump in the right breast, unspecified quadrant: Secondary | ICD-10-CM

## 2013-09-21 ENCOUNTER — Ambulatory Visit
Admission: RE | Admit: 2013-09-21 | Discharge: 2013-09-21 | Disposition: A | Discharge status: Home / Self Care | Payer: Self-pay | Source: Ambulatory Visit | Attending: Obstetrics & Gynecology | Admitting: Obstetrics & Gynecology

## 2013-09-21 ENCOUNTER — Other Ambulatory Visit: Payer: Self-pay | Admitting: Obstetrics & Gynecology

## 2013-09-21 ENCOUNTER — Ambulatory Visit: Payer: 59

## 2013-09-21 ENCOUNTER — Ambulatory Visit
Admission: RE | Admit: 2013-09-21 | Discharge: 2013-09-21 | Disposition: A | Discharge status: Home / Self Care | Payer: BC Managed Care – PPO | Source: Ambulatory Visit | Attending: Obstetrics & Gynecology | Admitting: Obstetrics & Gynecology

## 2013-09-21 DIAGNOSIS — N6320 Unspecified lump in the left breast, unspecified quadrant: Secondary | ICD-10-CM

## 2013-09-21 DIAGNOSIS — N6310 Unspecified lump in the right breast, unspecified quadrant: Secondary | ICD-10-CM

## 2013-09-24 ENCOUNTER — Ambulatory Visit
Admission: RE | Admit: 2013-09-24 | Discharge: 2013-09-24 | Disposition: A | Discharge status: Home / Self Care | Payer: BC Managed Care – PPO | Source: Ambulatory Visit | Attending: Obstetrics & Gynecology | Admitting: Obstetrics & Gynecology

## 2013-09-24 ENCOUNTER — Ambulatory Visit
Admission: RE | Admit: 2013-09-24 | Discharge: 2013-09-24 | Disposition: A | Discharge status: Home / Self Care | Payer: Self-pay | Source: Ambulatory Visit | Attending: Obstetrics & Gynecology | Admitting: Obstetrics & Gynecology

## 2014-06-03 ENCOUNTER — Ambulatory Visit (INDEPENDENT_AMBULATORY_CARE_PROVIDER_SITE_OTHER): Payer: BC Managed Care – PPO | Admitting: Emergency Medicine

## 2014-06-03 VITALS — BP 118/68 | HR 68 | Temp 98.2°F | Resp 17 | Ht 64.5 in | Wt 126.0 lb

## 2014-06-03 DIAGNOSIS — Z0000 Encounter for general adult medical examination without abnormal findings: Secondary | ICD-10-CM

## 2014-06-03 LAB — POCT URINALYSIS DIPSTICK
BILIRUBIN UA: NEGATIVE
GLUCOSE UA: NEGATIVE
KETONES UA: NEGATIVE
LEUKOCYTES UA: NEGATIVE
NITRITE UA: NEGATIVE
Protein, UA: NEGATIVE
RBC UA: NEGATIVE
Spec Grav, UA: 1.015
Urobilinogen, UA: 0.2
pH, UA: 5.5

## 2014-06-03 LAB — COMPREHENSIVE METABOLIC PANEL
ALBUMIN: 4.9 g/dL (ref 3.5–5.2)
ALT: 14 U/L (ref 0–35)
AST: 17 U/L (ref 0–37)
Alkaline Phosphatase: 43 U/L (ref 39–117)
BUN: 18 mg/dL (ref 6–23)
CALCIUM: 9.4 mg/dL (ref 8.4–10.5)
CHLORIDE: 103 meq/L (ref 96–112)
CO2: 25 mEq/L (ref 19–32)
Creat: 0.77 mg/dL (ref 0.50–1.10)
Glucose, Bld: 79 mg/dL (ref 70–99)
POTASSIUM: 3.9 meq/L (ref 3.5–5.3)
Sodium: 139 mEq/L (ref 135–145)
Total Bilirubin: 0.5 mg/dL (ref 0.2–1.2)
Total Protein: 7.3 g/dL (ref 6.0–8.3)

## 2014-06-03 LAB — POCT UA - MICROSCOPIC ONLY
BACTERIA, U MICROSCOPIC: NEGATIVE
Casts, Ur, LPF, POC: NEGATIVE
Crystals, Ur, HPF, POC: NEGATIVE
MUCUS UA: NEGATIVE
YEAST UA: NEGATIVE

## 2014-06-03 LAB — POCT CBC
GRANULOCYTE PERCENT: 62 % (ref 37–80)
HCT, POC: 37.8 % (ref 37.7–47.9)
HEMOGLOBIN: 12.5 g/dL (ref 12.2–16.2)
Lymph, poc: 1.4 (ref 0.6–3.4)
MCH, POC: 33.1 pg — AB (ref 27–31.2)
MCHC: 33 g/dL (ref 31.8–35.4)
MCV: 100.2 fL — AB (ref 80–97)
MID (cbc): 0.3 (ref 0–0.9)
MPV: 7.1 fL (ref 0–99.8)
POC GRANULOCYTE: 2.7 (ref 2–6.9)
POC LYMPH PERCENT: 30.7 %L (ref 10–50)
POC MID %: 7.3 % (ref 0–12)
Platelet Count, POC: 186 10*3/uL (ref 142–424)
RBC: 3.77 M/uL — AB (ref 4.04–5.48)
RDW, POC: 13 %
WBC: 4.4 10*3/uL — AB (ref 4.6–10.2)

## 2014-06-03 LAB — LIPID PANEL
Cholesterol: 158 mg/dL (ref 0–200)
HDL: 69 mg/dL (ref >39)
LDL Cholesterol: 74 mg/dL (ref 0–99)
Total CHOL/HDL Ratio: 2.3 Ratio
Triglycerides: 73 mg/dL (ref <150)
VLDL: 15 mg/dL (ref 0–40)

## 2014-06-03 NOTE — Progress Notes (Signed)
[facility] [address], [place] [address] [phone]  Date:  06/03/2014   Name:  Kelly StaggersMelissa M Bonano   DOB:  1965-09-02   MRN:  [id number]  PCP:  No PCP Per Patient    Chief Complaint: Annual Exam   History of Present Illness:  Kelly StaggersMelissa M Loewenstein is a 48 y.o. very pleasant female patient who presents with the following:  For wellness examination and and labs for work Denies other complaint or health concern today.   Patient Active Problem List   Diagnosis Date Noted  . DIARRHEA 11/04/2009    Past Medical History  Diagnosis Date  . Allergy   . Asthma     Past Surgical History  Procedure Laterality Date  . Cesarean section      History  Substance Use Topics  . Smoking status: Never Smoker   . Smokeless tobacco: Not on file  . Alcohol Use: No    Family History  Problem Relation Age of Onset  . Hyperlipidemia Mother   . Cancer Maternal Grandfather   . Heart disease Maternal Grandfather   . Cancer Paternal Grandfather     No Known Allergies  Medication list has been reviewed and updated.  No current outpatient prescriptions on file prior to visit.   No current facility-administered medications on file prior to visit.    Review of Systems:  As per HPI, otherwise negative.    Physical Examination: Filed Vitals:   06/03/14 0919  BP: 118/68  Pulse: 68  Temp: 98.2 F (36.8 C)  Resp: 17   Filed Vitals:   06/03/14 0919  Height: 5' 4.5" (1.638 m)  Weight: 126 lb (57.153 kg)   Body mass index is 21.3 kg/(m^2). Ideal Body Weight: Weight in (lb) to have BMI = 25: 147.6  GEN: WDWN, NAD, Non-toxic, A & O x 3 HEENT: Atraumatic, Normocephalic. Neck supple. No masses, No LAD. Ears and Nose: No external deformity. CV: RRR, No M/G/R. No JVD. No thrill. No extra heart sounds. PULM: CTA B, no wheezes, crackles, rhonchi. No retractions. No resp. distress. No accessory muscle use. ABD: S, NT, ND, +BS. No rebound. No HSM. EXTR: No  c/c/e NEURO Normal gait.  PSYCH: Normally interactive. Conversant. Not depressed or anxious appearing.  Calm demeanor.    Assessment and Plan: Wellness examination Labs pending  Signed,  Phillips OdorJeffery Marshel Golubski, MD

## 2014-07-01 ENCOUNTER — Other Ambulatory Visit: Payer: Self-pay

## 2014-07-01 DIAGNOSIS — Z1231 Encounter for screening mammogram for malignant neoplasm of breast: Secondary | ICD-10-CM

## 2014-09-23 ENCOUNTER — Ambulatory Visit
Admission: RE | Admit: 2014-09-23 | Discharge: 2014-09-23 | Disposition: A | Discharge status: Home / Self Care | Payer: BLUE CROSS/BLUE SHIELD | Source: Ambulatory Visit

## 2014-09-23 DIAGNOSIS — Z1231 Encounter for screening mammogram for malignant neoplasm of breast: Secondary | ICD-10-CM

## 2014-10-29 ENCOUNTER — Other Ambulatory Visit: Payer: Self-pay | Admitting: Obstetrics & Gynecology

## 2014-10-31 LAB — CYTOLOGY - PAP

## 2014-11-11 ENCOUNTER — Telehealth: Payer: Self-pay | Admitting: Gastroenterology

## 2014-11-11 NOTE — Telephone Encounter (Signed)
Pt returning call. [phone] you can call her back there when you return.

## 2014-11-11 NOTE — Telephone Encounter (Signed)
Left message on machine to call back  

## 2014-11-13 NOTE — Telephone Encounter (Signed)
Left message on machine to call back  

## 2014-11-14 NOTE — Telephone Encounter (Signed)
Pt has history of colitis, IBS, Celiac, is having epigastric pain 2-3 different episodes.  Tums, pepto, nothing helps and is point tender.  Vomiting and nausea.  These symptoms are different and more painful.  Eating makes her nausea with the pain.  Pt has been scheduled for 11/29/14 with Dr Christella HartiganJacobs

## 2014-11-29 ENCOUNTER — Other Ambulatory Visit (INDEPENDENT_AMBULATORY_CARE_PROVIDER_SITE_OTHER): Payer: BLUE CROSS/BLUE SHIELD

## 2014-11-29 ENCOUNTER — Encounter: Payer: Self-pay | Admitting: Gastroenterology

## 2014-11-29 ENCOUNTER — Ambulatory Visit (INDEPENDENT_AMBULATORY_CARE_PROVIDER_SITE_OTHER): Payer: BLUE CROSS/BLUE SHIELD | Admitting: Gastroenterology

## 2014-11-29 VITALS — BP 100/58 | HR 76 | Ht 63.75 in | Wt 127.2 lb

## 2014-11-29 DIAGNOSIS — R1013 Epigastric pain: Secondary | ICD-10-CM

## 2014-11-29 LAB — CBC WITH DIFFERENTIAL/PLATELET
Basophils Absolute: 0 10*3/uL (ref 0.0–0.1)
Basophils Relative: 0.7 % (ref 0.0–3.0)
Eosinophils Absolute: 0.2 10*3/uL (ref 0.0–0.7)
Eosinophils Relative: 5.2 % — ABNORMAL HIGH (ref 0.0–5.0)
HCT: 36.8 % (ref 36.0–46.0)
Hemoglobin: 12.6 g/dL (ref 12.0–15.0)
LYMPHS ABS: 1.3 10*3/uL (ref 0.7–4.0)
Lymphocytes Relative: 31.5 % (ref 12.0–46.0)
MCHC: 34.2 g/dL (ref 30.0–36.0)
MCV: 99 fl (ref 78.0–100.0)
MONO ABS: 0.3 10*3/uL (ref 0.1–1.0)
MONOS PCT: 7.7 % (ref 3.0–12.0)
NEUTROS PCT: 54.9 % (ref 43.0–77.0)
Neutro Abs: 2.3 10*3/uL (ref 1.4–7.7)
Platelets: 215 10*3/uL (ref 150.0–400.0)
RBC: 3.72 Mil/uL — AB (ref 3.87–5.11)
RDW: 12.3 % (ref 11.5–15.5)
WBC: 4.2 10*3/uL (ref 4.0–10.5)

## 2014-11-29 LAB — COMPREHENSIVE METABOLIC PANEL
ALBUMIN: 4.2 g/dL (ref 3.5–5.2)
ALT: 9 U/L (ref 0–35)
AST: 13 U/L (ref 0–37)
Alkaline Phosphatase: 52 U/L (ref 39–117)
BUN: 12 mg/dL (ref 6–23)
CALCIUM: 9.4 mg/dL (ref 8.4–10.5)
CHLORIDE: 103 meq/L (ref 96–112)
CO2: 32 meq/L (ref 19–32)
CREATININE: 0.8 mg/dL (ref 0.40–1.20)
GFR: 81.38 mL/min (ref >60.00)
Glucose, Bld: 84 mg/dL (ref 70–99)
Potassium: 3.7 mEq/L (ref 3.5–5.1)
Sodium: 139 mEq/L (ref 135–145)
Total Bilirubin: 0.5 mg/dL (ref 0.2–1.2)
Total Protein: 7.1 g/dL (ref 6.0–8.3)

## 2014-11-29 NOTE — Patient Instructions (Addendum)
This may be gallbladder related pains. You will be set up for an ultrasound and if stones are noted, you will be set up to see a surgeon to consider GB resection.  You have been scheduled for an abdominal ultrasound at [facility] (1st floor of hospital) on 12/05/14 at 730 am. Please arrive 15 minutes prior to your appointment for registration. Make certain not to have anything to eat or drink 6 hours prior to your appointment. Should you need to reschedule your appointment, please contact radiology at [phone]. This test typically takes about 30 minutes to perform. If normal GB, will have to consider repeat upper endoscopy (your last one was 8 years ago). You will have labs checked today in the basement lab.  Please head down after you check out with the front desk  (cbc, cmet).

## 2014-11-29 NOTE — Progress Notes (Signed)
Review of pertinent gastrointestinal problems: 1. Non-nodular barrett's appearing mucosa at GE jucntion, EGD Dr. Christella Hartigan 2008 done for dyspepsia,  bx showed pancreatic metaplasia  HPI: This is a  very pleasant 49 year old woman whom I last saw 4 years ago  Chief complaint is intermittent epigastric pain  Continues her chronic loose stools, abd pains, gassiness.  New pain, epigastric region. This a medium, not too sharp.  Gets nauseas. Will take pepcid, tums, pepto without any improvement. Has occurred twice very seriously.  Sort of burning pains.  Tender to touch.  Severe pains twice.  Those times lasted 6 hours, or even longer.  Early April and again a couple weeks ago.  NSAIDs less than once per week.  Has been vomiting intermittently as well.  No chance of pregnancy.  Overall stable weight.  Review of systems: Pertinent positive and negative review of systems were noted in the above HPI section. Complete review of systems was performed and was otherwise normal.   Past Medical History  Diagnosis Date  . Allergy   . Asthma     Past Surgical History  Procedure Laterality Date  . Cesarean section      Current Outpatient Prescriptions  Medication Sig Dispense Refill  . cetirizine (ZYRTEC) 10 MG tablet Take 10 mg by mouth daily.    . mometasone (NASONEX) 50 MCG/ACT nasal spray Place 2 sprays into the nose daily.     No current facility-administered medications for this visit.    Allergies as of 11/29/2014 - Review Complete 11/29/2014  Allergen Reaction Noted  . Latex Swelling and Other (See Comments) 11/29/2014    Family History  Problem Relation Age of Onset  . Hyperlipidemia Mother   . Lung cancer Maternal Grandfather   . Heart disease Maternal Grandfather   . Leukemia Paternal Grandfather   . Colon cancer Neg Hx   . Colon polyps Neg Hx   . Esophageal cancer Neg Hx   . Diabetes Paternal Grandmother     History   Social History  . Marital Status:  Married    Spouse Name: N/A  . Number of Children: 3  . Years of Education: N/A   Occupational History  . Therapist    Social History Main Topics  . Smoking status: Never Smoker   . Smokeless tobacco: Never Used  . Alcohol Use: 0.0 oz/week    0 Standard drinks or equivalent per week     Comment: Occassionally  . Drug Use: No  . Sexual Activity: No   Other Topics Concern  . Not on file   Social History Narrative     Physical Exam: BP 100/58 mmHg  Pulse 76  Ht 5' 3.75" (1.619 m)  Wt 127 lb 4 oz (57.72 kg)  BMI 22.02 kg/m2  LMP 11/23/2014 Constitutional: generally well-appearing Psychiatric: alert and oriented x3 Eyes: extraocular movements intact Mouth: oral pharynx moist, no lesions Neck: supple no lymphadenopathy Cardiovascular: heart regular rate and rhythm Lungs: clear to auscultation bilaterally Abdomen: soft, nontender, nondistended, no obvious ascites, no peritoneal signs, normal bowel sounds Extremities: no lower extremity edema bilaterally Skin: no lesions on visible extremities   Assessment and plan: 49 y.o. female with  2 episodes of significant epigastric pain associated with mild nausea  Concerned that she may have symptomatic gallbladder disease. She will have basic set of labs today including CBC, complete metabolic profile and we will arrange for ultrasound to be done area if gallstones are noted I will send her to see a surgeon to  consider cholecystectomy. If her gallbladder is normal and her labs are normal then I think the next step in evaluation for her intermittent epigastric pains it repeat upper endoscopy.   Rob Buntinganiel Jacobs, MD [facility] 11/29/2014, 9:00 AM  Cc: No ref. provider found

## 2014-12-05 ENCOUNTER — Ambulatory Visit (HOSPITAL_COMMUNITY)
Admission: RE | Admit: 2014-12-05 | Discharge: 2014-12-05 | Disposition: A | Discharge status: Home / Self Care | Payer: BLUE CROSS/BLUE SHIELD | Source: Ambulatory Visit | Attending: Gastroenterology | Admitting: Gastroenterology

## 2014-12-05 DIAGNOSIS — K7689 Other specified diseases of liver: Secondary | ICD-10-CM | POA: Diagnosis not present

## 2014-12-05 DIAGNOSIS — R112 Nausea with vomiting, unspecified: Secondary | ICD-10-CM | POA: Diagnosis not present

## 2014-12-05 DIAGNOSIS — R1013 Epigastric pain: Secondary | ICD-10-CM | POA: Diagnosis present

## 2015-02-11 ENCOUNTER — Encounter: Payer: BLUE CROSS/BLUE SHIELD | Admitting: Gastroenterology

## 2015-02-19 ENCOUNTER — Encounter: Payer: BLUE CROSS/BLUE SHIELD | Admitting: Gastroenterology

## 2015-02-23 ENCOUNTER — Ambulatory Visit (INDEPENDENT_AMBULATORY_CARE_PROVIDER_SITE_OTHER): Payer: Self-pay | Admitting: Physician Assistant

## 2015-02-23 VITALS — BP 91/55 | HR 71 | Temp 98.2°F | Resp 16 | Ht 64.0 in | Wt 129.0 lb

## 2015-02-23 DIAGNOSIS — Z0289 Encounter for other administrative examinations: Secondary | ICD-10-CM

## 2015-02-23 DIAGNOSIS — Z139 Encounter for screening, unspecified: Secondary | ICD-10-CM

## 2015-02-23 DIAGNOSIS — Z111 Encounter for screening for respiratory tuberculosis: Secondary | ICD-10-CM

## 2015-02-23 DIAGNOSIS — Z23 Encounter for immunization: Secondary | ICD-10-CM

## 2015-02-23 NOTE — Progress Notes (Signed)
   02/23/2015 at 4:47 PM  Kelly Bullock / DOB: 09-30-1965 / MRN: [id number]  The patient has DIARRHEA on her problem list.  SUBJECTIVE  Kelly Bullock is a 49 y.o. 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  She can not remember the last time she had a tetanus shot.  She needs a PPD today.  She does not know if she is immune to measles/mumps/rubella.      She  has a past medical history of Allergy and Asthma.    Medications reviewed and updated by myself where necessary, and exist elsewhere in the encounter.   Kelly Bullock is allergic to latex. She  reports that she has never smoked. She has never used smokeless tobacco. She reports that she drinks alcohol. She reports that she does not use illicit drugs. She  reports that she does not engage in sexual activity. The patient  has past surgical history that includes Cesarean section.  Her family history includes Diabetes in her paternal grandmother; Heart disease in her maternal grandfather; Hyperlipidemia in her mother; Leukemia in her paternal grandfather; Lung cancer in her maternal grandfather. There is no history of Colon cancer, Colon polyps, or Esophageal cancer.  Review of Systems  Constitutional: Negative for fever and chills.  Respiratory: Negative for shortness of breath.   Cardiovascular: Negative for chest pain.  Gastrointestinal: Negative for nausea and abdominal pain.  Genitourinary: Negative.   Skin: Negative for rash.  Neurological: Negative for dizziness and headaches.    OBJECTIVE  Her  height is  (1.626 m) and weight is 129 lb (58.514 kg). Her oral temperature is 98.2 F (36.8 C). Her blood pressure is 91/55 and her pulse is 71. Her respiration is 16 and oxygen saturation is 97%.  The patient's body mass index is 22.13 kg/(m^2).  Physical Exam  Constitutional: She is oriented to person,  place, and time. She appears well-developed and well-nourished. No distress.  Eyes: Pupils are equal, round, and reactive to light.  Cardiovascular: Normal rate and regular rhythm.   Respiratory: Effort normal and breath sounds normal. No respiratory distress.  GI: She exhibits no distension.  Neurological: She is alert and oriented to person, place, and time.  Skin: Skin is warm and dry. She is not diaphoretic.  Psychiatric: She has a normal mood and affect. Her behavior is normal. Judgment and thought content normal.    No results found for this or any previous visit (from the past 24 hour(s)).  No exam data present   ASSESSMENT & PLAN  Kelly Bullock was seen today for immunizations.  Diagnoses and all orders for this visit:  Encounter for occupational history and physical examination: Will send results to patient via Mychart.   Need for Tdap vaccination -     Tdap vaccine greater than or equal to 7yo IM  Screening-pulmonary TB -     TB Skin Test  Screening -     Measles/Mumps/Rubella Immunity    The patient was advised to call or come back to clinic if she does not see an improvement in symptoms, or worsens with the above plan.   Kelly Bullock, MHS, PA-C [facility] 02/23/2015 4:47 PM

## 2015-02-25 ENCOUNTER — Ambulatory Visit (INDEPENDENT_AMBULATORY_CARE_PROVIDER_SITE_OTHER): Payer: Self-pay

## 2015-02-25 ENCOUNTER — Other Ambulatory Visit: Payer: Self-pay | Admitting: Physician Assistant

## 2015-02-25 DIAGNOSIS — Z111 Encounter for screening for respiratory tuberculosis: Secondary | ICD-10-CM

## 2015-02-25 LAB — MEASLES/MUMPS/RUBELLA IMMUNITY
Mumps IgG: 154 AU/mL — ABNORMAL HIGH (ref <9.00)
RUBEOLA IGG: 13.2 [AU]/ml (ref <25.00)
Rubella: 4.02 Index — ABNORMAL HIGH (ref <0.90)

## 2015-02-25 NOTE — Progress Notes (Signed)
   Subjective:    Patient ID: Kelly Bullock, female    DOB: 1965/07/28, 49 y.o.   MRN: [id number]  HPI 49 year old female here PPD read. PPD negative, 0mm induration.   Review of Systems     Objective:   Physical Exam        Assessment & Plan:

## 2015-07-31 ENCOUNTER — Encounter: Payer: Self-pay | Admitting: Gastroenterology

## 2015-08-22 ENCOUNTER — Other Ambulatory Visit: Payer: Self-pay

## 2015-08-22 DIAGNOSIS — Z1231 Encounter for screening mammogram for malignant neoplasm of breast: Secondary | ICD-10-CM

## 2015-09-24 ENCOUNTER — Ambulatory Visit
Admission: RE | Admit: 2015-09-24 | Discharge: 2015-09-24 | Disposition: A | Discharge status: Home / Self Care | Payer: BC Managed Care – PPO | Source: Ambulatory Visit

## 2015-09-24 DIAGNOSIS — Z1231 Encounter for screening mammogram for malignant neoplasm of breast: Secondary | ICD-10-CM

## 2015-11-04 ENCOUNTER — Other Ambulatory Visit: Payer: Self-pay | Admitting: Obstetrics & Gynecology

## 2015-11-04 DIAGNOSIS — N6311 Unspecified lump in the right breast, upper outer quadrant: Secondary | ICD-10-CM

## 2015-11-07 ENCOUNTER — Other Ambulatory Visit: Payer: BC Managed Care – PPO

## 2015-11-13 ENCOUNTER — Ambulatory Visit
Admission: RE | Admit: 2015-11-13 | Discharge: 2015-11-13 | Disposition: A | Discharge status: Home / Self Care | Payer: BC Managed Care – PPO | Source: Ambulatory Visit | Attending: Obstetrics & Gynecology | Admitting: Obstetrics & Gynecology

## 2015-11-13 DIAGNOSIS — N6311 Unspecified lump in the right breast, upper outer quadrant: Secondary | ICD-10-CM

## 2015-11-14 ENCOUNTER — Other Ambulatory Visit: Payer: BC Managed Care – PPO

## 2016-06-01 ENCOUNTER — Ambulatory Visit (INDEPENDENT_AMBULATORY_CARE_PROVIDER_SITE_OTHER): Payer: BC Managed Care – PPO | Admitting: Family Medicine

## 2016-06-01 VITALS — BP 96/60 | HR 52 | Temp 98.6°F | Resp 16 | Ht 64.5 in | Wt 131.0 lb

## 2016-06-01 DIAGNOSIS — H6592 Unspecified nonsuppurative otitis media, left ear: Secondary | ICD-10-CM

## 2016-06-01 DIAGNOSIS — J0100 Acute maxillary sinusitis, unspecified: Secondary | ICD-10-CM | POA: Diagnosis not present

## 2016-06-01 MED ORDER — IPRATROPIUM BROMIDE 0.03 % NA SOLN: 2.0000 | Freq: Two times a day (BID) | NASAL | 0 refills | Status: DC

## 2016-06-01 MED ORDER — AMOXICILLIN-POT CLAVULANATE 875-125 MG PO TABS: 1.0000 | ORAL_TABLET | Freq: Two times a day (BID) | ORAL | 0 refills | Status: DC

## 2016-06-01 NOTE — Patient Instructions (Addendum)
Augmentin 1 tablet twice daily for 10 days.  Atrovent 2 puffs twice daily as needed for nasal congestion.  IF you received an x-ray today, you will receive an invoice from [facility]. Please contact [facility] at [phone] with questions or concerns regarding your invoice.   IF you received labwork today, you will receive an invoice from [facility]. Please contact [facility] at [phone] with questions or concerns regarding your invoice.   Our billing staff will not be able to assist you with questions regarding bills from these companies.  You will be contacted with the lab results as soon as they are available. The fastest way to get your results is to activate your My Chart account. Instructions are located on the last page of this paperwork. If you have not heard from us regarding the results in 2 weeks, please contact this office.     Sinusitis, Adult Sinusitis is soreness and inflammation of your sinuses. Sinuses are hollow spaces in the bones around your face. They are located:  Around your eyes.  In the middle of your forehead.  Behind your nose.  In your cheekbones. Your sinuses and nasal passages are lined with a stringy fluid (mucus). Mucus normally drains out of your sinuses. When your nasal tissues get inflamed or swollen, the mucus can get trapped or blocked so air cannot flow through your sinuses. This lets bacteria, viruses, and funguses grow, and that leads to infection. Follow these instructions at home: Medicines  Take, use, or apply over-the-counter and prescription medicines only as told by your doctor. These may include nasal sprays.  If you were prescribed an antibiotic medicine, take it as told by your doctor. Do not stop taking the antibiotic even if you start to feel better. Hydrate and Humidify  Drink enough water to keep your pee (urine) clear or pale yellow.  Use a cool mist humidifier to keep the  humidity level in your home above 50%.  Breathe in steam for 10-15 minutes, 3-4 times a day or as told by your doctor. You can do this in the bathroom while a hot shower is running.  Try not to spend time in cool or dry air. Rest  Rest as much as possible.  Sleep with your head raised (elevated).  Make sure to get enough sleep each night. General instructions  Put a warm, moist washcloth on your face 3-4 times a day or as told by your doctor. This will help with discomfort.  Wash your hands often with soap and water. If there is no soap and water, use hand sanitizer.  Do not smoke. Avoid being around people who are smoking (secondhand smoke).  Keep all follow-up visits as told by your doctor. This is important. Contact a doctor if:  You have a fever.  Your symptoms get worse.  Your symptoms do not get better within 10 days. Get help right away if:  You have a very bad headache.  You cannot stop throwing up (vomiting).  You have pain or swelling around your face or eyes.  You have trouble seeing.  You feel confused.  Your neck is stiff.  You have trouble breathing. This information is not intended to replace advice given to you by your health care provider. Make sure you discuss any questions you have with your health care provider. Document Released: 12/08/2007 Document Revised: 02/15/2016 Document Reviewed: 04/16/2015 Elsevier Interactive Patient Education  2017 [employer]

## 2016-06-01 NOTE — Progress Notes (Signed)
   Patient ID: Kelly Bullock, female    DOB: [birth year], 50 y.o.   MRN: [id number]  PCP: No PCP Per Patient  Chief Complaint  Patient presents with  . Sinusitis    pressure and pain onset 3 days  . Nasal Congestion    onset 4 weeks    Subjective:   HPI 50 year old female presents for sinus pressure x 3 days and nasal congestion x 4 weeks. [facility]. Reports an upper respiratory illness times 4 weeks. Maxillary pain below eye. Pressure and pain in bilateral ears. Reports  thicken mucus and nose bleeding. Left sided facial pain. He has taken Nyquil which helped achieve rest. Reports diminished  smell or taste, and teeth pain.  Social History   Social History  . Marital status: Married    Spouse name: N/A  . Number of children: 3  . Years of education: N/A   Occupational History  . Therapist    Social History Main Topics  . Smoking status: Never Smoker  . Smokeless tobacco: Never Used  . Alcohol use 0.0 oz/week     Comment: Occassionally  . Drug use: No  . Sexual activity: No   Other Topics Concern  . Not on file   Social History Narrative  . No narrative on file    Family History  Problem Relation Age of Onset  . Hyperlipidemia Mother   . Lung cancer Maternal Grandfather   . Heart disease Maternal Grandfather   . Leukemia Paternal Grandfather   . Colon cancer Neg Hx   . Colon polyps Neg Hx   . Esophageal cancer Neg Hx   . Diabetes Paternal Grandmother      Review of Systems  See hpi   Patient Active Problem List   Diagnosis Date Noted  . DIARRHEA 11/04/2009     Prior to Admission medications   Medication Sig Start Date End Date Taking? Authorizing Provider  cetirizine (ZYRTEC) 10 MG tablet Take 10 mg by mouth daily.   Yes Historical Provider, MD  mometasone (NASONEX) 50 MCG/ACT nasal spray Place 2 sprays into the nose daily.   Yes Historical Provider, MD    Allergies  Allergen Reactions  . Latex Swelling and Other (See Comments)      Redness to skin       Objective:  Physical Exam  Constitutional: She is oriented to person, place, and time. She appears well-developed and well-nourished.  HENT:  Right Ear: A middle ear effusion is present.  Left Ear: A middle ear effusion is present.  Nose: Mucosal edema, rhinorrhea and sinus tenderness present. Right sinus exhibits maxillary sinus tenderness. Left sinus exhibits maxillary sinus tenderness and frontal sinus tenderness.  Mouth/Throat: No posterior oropharyngeal edema or posterior oropharyngeal erythema.  Cardiovascular: Normal rate, regular rhythm and normal heart sounds.   Pulmonary/Chest: Effort normal and breath sounds normal.  Neurological: She is alert and oriented to person, place, and time.  Skin: Skin is warm and dry.    Vitals:   06/01/16 1007  BP: 96/60  Pulse: (!) 52  Resp: 16  Temp: 98.6 F (37 C)     Assessment & Plan:  1. Acute non-recurrent maxillary sinusitis 2. Middle ear effusion, left  Plan: -Amoxicillin-Clavulanate 875-125 mg, twice daily x 10 days -Ipratropium (Atrovent) 0.03% nasal spray, 2 sprays, 2 times daily -Continue Zyrtec daily to relieve ear pressure  Godfrey PickKimberly S. Tiburcio PeaHarris, MSN, FNP-C [facility]

## 2016-08-11 ENCOUNTER — Other Ambulatory Visit: Payer: Self-pay | Admitting: Obstetrics & Gynecology

## 2016-08-11 DIAGNOSIS — Z1231 Encounter for screening mammogram for malignant neoplasm of breast: Secondary | ICD-10-CM

## 2016-09-24 ENCOUNTER — Ambulatory Visit
Admission: RE | Admit: 2016-09-24 | Discharge: 2016-09-24 | Disposition: A | Discharge status: Home / Self Care | Payer: BC Managed Care – PPO | Source: Ambulatory Visit | Attending: Obstetrics & Gynecology | Admitting: Obstetrics & Gynecology

## 2016-09-24 DIAGNOSIS — Z1231 Encounter for screening mammogram for malignant neoplasm of breast: Secondary | ICD-10-CM

## 2016-09-27 ENCOUNTER — Other Ambulatory Visit: Payer: Self-pay | Admitting: Obstetrics & Gynecology

## 2016-09-27 DIAGNOSIS — N63 Unspecified lump in unspecified breast: Secondary | ICD-10-CM

## 2016-10-07 ENCOUNTER — Other Ambulatory Visit: Payer: BC Managed Care – PPO

## 2016-10-12 ENCOUNTER — Ambulatory Visit
Admission: RE | Admit: 2016-10-12 | Discharge: 2016-10-12 | Disposition: A | Discharge status: Home / Self Care | Payer: BC Managed Care – PPO | Source: Ambulatory Visit | Attending: Obstetrics & Gynecology | Admitting: Obstetrics & Gynecology

## 2016-10-12 DIAGNOSIS — N63 Unspecified lump in unspecified breast: Secondary | ICD-10-CM

## 2016-10-12 HISTORY — DX: Unspecified lump in unspecified breast: N63.0

## 2017-09-09 ENCOUNTER — Other Ambulatory Visit: Payer: Self-pay | Admitting: Obstetrics & Gynecology

## 2017-09-09 DIAGNOSIS — Z139 Encounter for screening, unspecified: Secondary | ICD-10-CM

## 2017-10-13 ENCOUNTER — Ambulatory Visit
Admission: RE | Admit: 2017-10-13 | Discharge: 2017-10-13 | Disposition: A | Discharge status: Home / Self Care | Payer: BC Managed Care – PPO | Source: Ambulatory Visit | Attending: Obstetrics & Gynecology | Admitting: Obstetrics & Gynecology

## 2017-10-13 DIAGNOSIS — Z139 Encounter for screening, unspecified: Secondary | ICD-10-CM

## 2017-10-14 ENCOUNTER — Other Ambulatory Visit: Payer: Self-pay | Admitting: Obstetrics & Gynecology

## 2017-10-14 DIAGNOSIS — R921 Mammographic calcification found on diagnostic imaging of breast: Secondary | ICD-10-CM

## 2017-10-20 ENCOUNTER — Ambulatory Visit
Admission: RE | Admit: 2017-10-20 | Discharge: 2017-10-20 | Disposition: A | Discharge status: Home / Self Care | Payer: BC Managed Care – PPO | Source: Ambulatory Visit | Attending: Obstetrics & Gynecology | Admitting: Obstetrics & Gynecology

## 2017-10-20 DIAGNOSIS — R921 Mammographic calcification found on diagnostic imaging of breast: Secondary | ICD-10-CM

## 2018-11-03 ENCOUNTER — Other Ambulatory Visit: Payer: Self-pay | Admitting: Obstetrics & Gynecology

## 2018-11-03 DIAGNOSIS — Z1231 Encounter for screening mammogram for malignant neoplasm of breast: Secondary | ICD-10-CM

## 2018-11-13 ENCOUNTER — Ambulatory Visit: Payer: BC Managed Care – PPO

## 2018-12-06 ENCOUNTER — Encounter: Payer: Self-pay | Admitting: Gastroenterology

## 2018-12-28 ENCOUNTER — Ambulatory Visit
Admission: RE | Admit: 2018-12-28 | Discharge: 2018-12-28 | Disposition: A | Discharge status: Home / Self Care | Payer: BC Managed Care – PPO | Source: Ambulatory Visit | Attending: Obstetrics & Gynecology | Admitting: Obstetrics & Gynecology

## 2018-12-28 ENCOUNTER — Other Ambulatory Visit: Payer: Self-pay

## 2018-12-28 DIAGNOSIS — Z1231 Encounter for screening mammogram for malignant neoplasm of breast: Secondary | ICD-10-CM

## 2019-01-02 ENCOUNTER — Ambulatory Visit (AMBULATORY_SURGERY_CENTER): Payer: Self-pay | Admitting: *Deleted

## 2019-01-02 ENCOUNTER — Encounter: Payer: Self-pay | Admitting: Gastroenterology

## 2019-01-02 ENCOUNTER — Other Ambulatory Visit: Payer: Self-pay

## 2019-01-02 VITALS — Ht 64.0 in | Wt 129.0 lb

## 2019-01-02 DIAGNOSIS — Z1211 Encounter for screening for malignant neoplasm of colon: Secondary | ICD-10-CM

## 2019-01-02 MED ORDER — NA SULFATE-K SULFATE-MG SULF 17.5-3.13-1.6 GM/177ML PO SOLN: ORAL | 0 refills | Status: DC

## 2019-01-02 NOTE — Progress Notes (Signed)
Patient's pre-visit was done today over the phone with the patient due to COVID-19 pandemic. Name,DOB and address verified. Insurance verified. Packet of Prep instructions mailed to patient including copy of a consent form and pre-procedure patient acknowledgement form-pt is aware. Suprep $15  Coupon included. Patient request Suprep-husband was here recently and she does not feel she can drink the Golytely  Prep-pt aware of the Suprep cost. Patient understands to call us back with any questions or concerns. Patient denies any allergies to eggs or soy. Patient denies any problems with anesthesia/sedation. Patient denies any oxygen use at home. Patient denies taking any diet/weight loss medications or blood thinners. EMMI education assisgned to patient on colonoscopy, this was explained and instructions given to patient. Pt is aware that care partner will wait in the car in the parking lot; if they feel like they will be too hot to wait in the car; they may wait in the lobby.  We want them to wear a mask (we do not have any that we can provide them), practice social distancing, and we will check their temperatures when they get here.  I did remind patient that their care partner needs to stay in the parking lot the entire time. Pt will wear mask into building

## 2019-01-15 ENCOUNTER — Telehealth: Payer: Self-pay | Admitting: Gastroenterology

## 2019-01-15 NOTE — Telephone Encounter (Signed)
Spoke with patient regarding Covid-19 screening questions. °Covid-19 Screening Questions: °  °Do you now or have you had a fever in the last 14 days?  °  °Do you have any respiratory symptoms of shortness of breath or cough now or in the last 14 days?  °  °Do you have any family members or close contacts with diagnosed  °or suspected Covid-19 in the past 14 days?  °  °Have you been tested for Covid-19 and found to be positive?  °  ° °

## 2019-01-16 ENCOUNTER — Other Ambulatory Visit: Payer: Self-pay

## 2019-01-16 ENCOUNTER — Ambulatory Visit (AMBULATORY_SURGERY_CENTER): Payer: BC Managed Care – PPO | Admitting: Gastroenterology

## 2019-01-16 ENCOUNTER — Encounter: Payer: Self-pay | Admitting: Gastroenterology

## 2019-01-16 VITALS — BP 100/62 | HR 64 | Temp 98.5°F | Resp 17 | Ht 64.0 in | Wt 129.0 lb

## 2019-01-16 DIAGNOSIS — Z1211 Encounter for screening for malignant neoplasm of colon: Secondary | ICD-10-CM

## 2019-01-16 DIAGNOSIS — R197 Diarrhea, unspecified: Secondary | ICD-10-CM | POA: Diagnosis not present

## 2019-01-16 DIAGNOSIS — K52832 Lymphocytic colitis: Secondary | ICD-10-CM

## 2019-01-16 MED ORDER — SODIUM CHLORIDE 0.9 % IV SOLN: 500.0000 mL | Freq: Once | INTRAVENOUS | Status: DC

## 2019-01-16 NOTE — Progress Notes (Signed)
Patient keeps stating that the last time she had this she "had trouble passing gas."  Dr. Ardis Hughs is aware. No orders given.  Patient is passing some gas. IV remains in until patient can get up to the bathroom.

## 2019-01-16 NOTE — Op Note (Signed)
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 Randa EvensEdwards Medicines:                Monitored Anesthesia Care Procedure:                Pre-Anesthesia Assessment:                           - Prior to the procedure, a History and Physical                            was performed, and patient medications and                            allergies were reviewed. The patient's tolerance of                            previous anesthesia was also reviewed. The risks                            and benefits of the procedure and the sedation                            options and risks were discussed with the patient.                            All questions were answered, and informed consent                            was obtained. Prior Anticoagulants: The patient has                            taken no previous anticoagulant or antiplatelet                            agents. ASA Grade Assessment: II - A patient with                            mild systemic disease. After reviewing the risks                            and benefits, the patient was deemed in  satisfactory condition to undergo the procedure.                           After obtaining informed consent, the colonoscope                            was passed under direct vision. Throughout the                            procedure, the patient's blood pressure, pulse, and                            oxygen saturations were monitored continuously. The                            Colonoscope was  introduced through the anus and                            advanced to the the terminal ileum. The colonoscopy                            was performed without difficulty. The patient                            tolerated the procedure well. The quality of the                            bowel preparation was good. The terminal ileum,                            ileocecal valve, appendiceal orifice, and rectum                            were photographed. Scope In: 9:31:01 AM Scope Out: 9:42:51 AM Scope Withdrawal Time: 0 hours 6 minutes 4 seconds  Total Procedure Duration: 0 hours 11 minutes 50 seconds  Findings:                 The terminal ileum appeared normal.                           The entire examined colon appeared normal on direct                            and retroflexion views.                           Biopsies for histology were taken with a cold                            forceps from the entire colon for evaluation of                            microscopic colitis. Complications:            No immediate complications.  Estimated blood loss:                            None. Estimated Blood Loss:     Estimated blood loss: none. Impression:               - The examined portion of the ileum was normal.                           - The entire examined colon is normal on direct and                            retroflexion views.                           - Biopsies were taken with a cold forceps from the                            entire colon for evaluation of microscopic colitis. Recommendation:           - Patient has a contact number available for                            emergencies. The signs and symptoms of potential                            delayed complications were discussed with the                            patient. Return to normal activities tomorrow.                            Written discharge instructions were provided to the                             patient.                           - Resume previous diet.                           - Continue present medications.                           - Repeat colonoscopy in 10 years for screening. Milus Banister, MD 01/16/2019 9:45:08 AM This report has been signed electronically.

## 2019-01-16 NOTE — Progress Notes (Signed)
Called to room to assist during endoscopic procedure.  Patient ID and intended procedure confirmed with present staff. Received instructions for my participation in the procedure from the performing physician.  

## 2019-01-16 NOTE — Progress Notes (Signed)
Report given to PACU, vss 

## 2019-01-16 NOTE — Patient Instructions (Signed)
YOU HAD AN ENDOSCOPIC PROCEDURE TODAY AT [facility]:   Refer to the procedure report that was given to you for any specific questions about what was found during the examination.  If the procedure report does not answer your questions, please call your gastroenterologist to clarify.  If you requested that your care partner not be given the details of your procedure findings, then the procedure report has been included in a sealed envelope for you to review at your convenience later.  YOU SHOULD EXPECT: Some feelings of bloating in the abdomen. Passage of more gas than usual.  Walking can help get rid of the air that was put into your GI tract during the procedure and reduce the bloating. If you had a lower endoscopy (such as a colonoscopy or flexible sigmoidoscopy) you may notice spotting of blood in your stool or on the toilet paper. If you underwent a bowel prep for your procedure, you may not have a normal bowel movement for a few days.  Please Note:  You might notice some irritation and congestion in your nose or some drainage.  This is from the oxygen used during your procedure.  There is no need for concern and it should clear up in a day or so.  SYMPTOMS TO REPORT IMMEDIATELY:   Following lower endoscopy (colonoscopy or flexible sigmoidoscopy):  Excessive amounts of blood in the stool  Significant tenderness or worsening of abdominal pains  Swelling of the abdomen that is new, acute  Fever of 100F or higher   For urgent or emergent issues, a gastroenterologist can be reached at any hour by calling [phone].   DIET:  We do recommend a small meal at first, but then you may proceed to your regular diet.  Drink plenty of fluids but you should avoid alcoholic beverages for 24 hours.  ACTIVITY:  You should plan to take it easy for the rest of today and you should NOT DRIVE or use heavy machinery until tomorrow (because of the sedation medicines used during the test).     FOLLOW UP: Our staff will call the number listed on your records 48-72 hours following your procedure to check on you and address any questions or concerns that you may have regarding the information given to you following your procedure. If we do not reach you, we will leave a message.  We will attempt to reach you two times.  During this call, we will ask if you have developed any symptoms of COVID 19. If you develop any symptoms (ie: fever, flu-like symptoms, shortness of breath, cough etc.) before then, please call [phone].  If you test positive for Covid 19 in the 2 weeks post procedure, please call and report this information to [place].    If any biopsies were taken you will be contacted by phone or by letter within the next 1-3 weeks.  Please call us at [phone] if you have not heard about the biopsies in 3 weeks.    SIGNATURES/CONFIDENTIALITY: You and/or your care partner have signed paperwork which will be entered into your electronic medical record.  These signatures attest to the fact that that the information above on your After Visit Summary has been reviewed and is understood.  Full responsibility of the confidentiality of this discharge information lies with you and/or your care-partner.  Thank-you for choosing [place] for your healthcare needs today.

## 2019-01-16 NOTE — Progress Notes (Signed)
Pt's states no medical or surgical changes since previsit or office visit. 

## 2019-01-18 ENCOUNTER — Telehealth: Payer: Self-pay

## 2019-01-18 NOTE — Telephone Encounter (Signed)
No answer, left message to call back later today, B.Early Steel RN. 

## 2019-01-18 NOTE — Telephone Encounter (Signed)
  Follow up Call-  Call back number 01/16/2019  Post procedure Call Back phone  # [phone]  Permission to leave phone message Yes  Some recent data might be hidden     Patient questions:  Do you have a fever, pain , or abdominal swelling? No. Pain Score  0 *  Have you tolerated food without any problems? Yes.    Have you been able to return to your normal activities? Yes.    Do you have any questions about your discharge instructions: Diet   No. Medications  No. Follow up visit  No.  Do you have questions or concerns about your Care? No.  Actions: * If pain score is 4 or above: No action needed, pain <4.  1. Have you developed a fever since your procedure? no  2.   Have you had an respiratory symptoms (SOB or cough) since your procedure? no  3.   Have you tested positive for COVID 19 since your procedure no  4.   Have you had any family members/close contacts diagnosed with the COVID 19 since your procedure?  no   If yes to any of these questions please route to Joylene John, RN and Alphonsa Gin, Therapist, sports.

## 2019-03-02 ENCOUNTER — Ambulatory Visit: Payer: BC Managed Care – PPO | Admitting: Gastroenterology

## 2019-11-29 ENCOUNTER — Other Ambulatory Visit: Payer: Self-pay | Admitting: Obstetrics & Gynecology

## 2019-11-29 DIAGNOSIS — Z1231 Encounter for screening mammogram for malignant neoplasm of breast: Secondary | ICD-10-CM

## 2020-01-01 ENCOUNTER — Ambulatory Visit
Admission: RE | Admit: 2020-01-01 | Discharge: 2020-01-01 | Disposition: A | Discharge status: Home / Self Care | Payer: BC Managed Care – PPO | Source: Ambulatory Visit | Attending: Obstetrics & Gynecology | Admitting: Obstetrics & Gynecology

## 2020-01-01 ENCOUNTER — Other Ambulatory Visit: Payer: Self-pay

## 2020-01-01 DIAGNOSIS — Z1231 Encounter for screening mammogram for malignant neoplasm of breast: Secondary | ICD-10-CM

## 2020-09-29 ENCOUNTER — Other Ambulatory Visit: Payer: Self-pay | Admitting: Obstetrics & Gynecology

## 2020-09-29 DIAGNOSIS — Z1231 Encounter for screening mammogram for malignant neoplasm of breast: Secondary | ICD-10-CM

## 2020-12-25 ENCOUNTER — Other Ambulatory Visit: Payer: Self-pay | Admitting: Obstetrics & Gynecology

## 2020-12-25 DIAGNOSIS — N63 Unspecified lump in unspecified breast: Secondary | ICD-10-CM

## 2020-12-29 ENCOUNTER — Other Ambulatory Visit: Payer: Self-pay | Admitting: Obstetrics & Gynecology

## 2020-12-29 DIAGNOSIS — N63 Unspecified lump in unspecified breast: Secondary | ICD-10-CM

## 2021-01-01 ENCOUNTER — Ambulatory Visit
Admission: RE | Admit: 2021-01-01 | Discharge: 2021-01-01 | Disposition: A | Discharge status: Home / Self Care | Payer: BC Managed Care – PPO | Source: Ambulatory Visit | Attending: Obstetrics & Gynecology | Admitting: Obstetrics & Gynecology

## 2021-01-01 ENCOUNTER — Ambulatory Visit: Payer: BC Managed Care – PPO

## 2021-01-01 ENCOUNTER — Other Ambulatory Visit: Payer: Self-pay

## 2021-01-01 DIAGNOSIS — N63 Unspecified lump in unspecified breast: Secondary | ICD-10-CM

## 2021-06-12 ENCOUNTER — Encounter: Payer: Self-pay | Admitting: Nurse Practitioner

## 2021-06-12 ENCOUNTER — Ambulatory Visit: Payer: BC Managed Care – PPO | Admitting: Nurse Practitioner

## 2021-06-12 VITALS — BP 112/62 | HR 66 | Ht 64.0 in | Wt 124.0 lb

## 2021-06-12 DIAGNOSIS — R195 Other fecal abnormalities: Secondary | ICD-10-CM

## 2021-06-12 DIAGNOSIS — R1030 Lower abdominal pain, unspecified: Secondary | ICD-10-CM

## 2021-06-12 DIAGNOSIS — K52832 Lymphocytic colitis: Secondary | ICD-10-CM

## 2021-06-12 MED ORDER — HYOSCYAMINE SULFATE 0.125 MG SL SUBL: 0.1250 mg | SUBLINGUAL_TABLET | Freq: Two times a day (BID) | SUBLINGUAL | 0 refills | Status: AC | PRN

## 2021-06-12 MED ORDER — BUDESONIDE 3 MG PO CPEP: 9.0000 mg | ORAL_CAPSULE | Freq: Every day | ORAL | 3 refills | Status: DC

## 2021-06-12 NOTE — Progress Notes (Signed)
ASSESSMENT AND PLAN     # 55 yo female with chronic loose stool and generalized abdominal discomfort. Symptoms probably multifactorial . Gives history of IBS but at some point years ago she was diagnosed with lymphocytic colitis .  Colonoscopy by Dr. Christella Hartigan in 2020 showed normal mucosa but biopsies also confirmed chronic lymphocytic colitis.  Imodium helped but she eventually stopped because she thought it was just masking a problem  -- Trial of budesonide 9 mg daily. Year ago her GYN gave her what sounds like Budesonide and it really helped but symptoms recurred within a few day of stopping treatment -- Patient expresses some concern about a concurrent diagnosis.  Family members have celiac disease and sister just got diagnosed with Crohn's. No evidence for IBD on July 2020. Her celiac studies in 2011 were negative though she had been following a low to no gluten diet at the time. Could retest but she would have to reintroduce gluten into her diet and she would understandably rather not.  -- She will continue to avoid culprit foods --Follow-up with Dr. Christella Hartigan early February.  Continue budesonide until follow-up appointment.  Call in the interim for problems or questions  #Very occasional severe lower abdominal pain.  Pain unlike her chronic abdominal discomfort as mentioned above.  Episodes are very random, can last nearly a day.  No associated urinary symptoms. She has been evaluated by GYN.  Etiology unclear, possibly intestinal spasm?  -- Trial of a bowel antispasmodic.  Since episodes are so random I think she would benefit from a sublingual form of medication.  We will try Levsin 0.125 mg SL twice daily as needed   HISTORY OF PRESENT ILLNESS    Chief Complaint : chronic loose stool , abdominal pain and intermittent severe lower abdomina pain  Kelly Bullock is a 55 y.o. female known to Dr. Christella Hartigan with a past medical history of lymphocytic colitis, ? IBS.  Additional medical history  as listed in PMH   Patient had an EGD in 2008 for dyspepsia. bx showed pancreatic metaplasia.   Patient was seen again in 2016 for evaluation of significant epigastric pain nausea.  There was concern for gallbladder disease but the ultrasound was normal.  Plan was for upper endoscopy.  It appears patient canceled the procedure  We then saw Kelly Bullock for colonoscopy for screening purposes but also evaluation of loose stool and reported history of lymphocytic colitis. The colon was normal but random biopsies were compatible with chronic lymphocytic colitis.. Advised to take Imodium every morning. She took 2 imodium a day for months and it helped but she felt as though she was just covering up a problem  .INTERVAL HISTORY Aunt and cousin have Celiac and sister just got diagnosed with Crohn's. Patient omitted Gluten 7 years ago. Following that her stool consistency improved from watery to mushy and unformed .   She herself can tolerate a very small amount of gluten  She tells me that when we did her gluten studies that she had been off gluten for years and she was told that the studies may not be completely accurate.   [name] is occasionally able to have a normal bowel movement. The majority of time her stools are mushy and associated with intestinal gurgling and generalized abdominal discomfort. To avoid exacerbation of symptoms she avoids trigger foods  (such as wine, gluten and nuts).  Stress is also a trigger. She thinks that 5 years ago her GYN gave her a course  of Budesonide which she took for about one month with improvement in symptoms. Within a few days of stopping Budesonide she developed recurrent loose stool  Patient says she has lived with these GI issues for years but over the last several years she been having very infrequent episodes of severe knifelike lower abdominal pain.  Episodes are random and she cannot get an appt to be seen during an episode.  Episodes are not associated with  urinary symptoms or vaginal symptoms . The llast episode was about 3 weeks ago,  lasted about 20 hours. She felt like her guts was swollen. She was desperate for pain relief , took 2-3 doses of son's prednisone and feels like that helped. The next day her stools were more solid than usual but they were tiny  / thin.  She had been under a lot of stress and possibly dehydrated but cannot think of any other potential causes of the episode.    PREVIOUS ENDOSCOPIC EVALUATIONS / PERTINENT STUDIES:   July 2020 colonoscopy for screening but also loose stool and ?  History of lymphocytic colitis -The examined portion of the ileum was normal. - The entire examined colon is normal on direct and retroflexion views. - Biopsies were taken with a cold forceps from the entire colon for evaluation of microscopic colitis.  Diagnosis Surgical [P], random colon sites - CHRONIC LYMPHOCYTIC COLITIS. SEE NOTE   Current Medications, Allergies, Past Medical History, Past Surgical History, Family History and Social History were reviewed in [employer] record.     Current Outpatient Medications  Medication Sig Dispense Refill   cetirizine (ZYRTEC) 10 MG tablet Take 10 mg by mouth daily.     estradiol (CLIMARA - DOSED IN MG/24 HR) 0.05 mg/24hr patch Place 0.05 mg onto the skin once a week.     fluticasone (FLONASE) 50 MCG/ACT nasal spray Place into both nostrils daily.     progesterone (PROMETRIUM) 200 MG capsule Take 200 mg by mouth at bedtime.     UNABLE TO FIND Med Name: Barnett Hatter for menopause     No current facility-administered medications for this visit.    Review of Systems: No chest pain. No shortness of breath. No urinary complaints.   PHYSICAL EXAM :    Wt Readings from Last 3 Encounters:  06/12/21 124 lb (56.2 kg)  01/16/19 129 lb (58.5 kg)  01/02/19 129 lb (58.5 kg)    BP 112/62   Pulse 66   Ht 5\' 4"  (1.626 m)   Wt 124 lb (56.2 kg)   SpO2 98%   BMI 21.28 kg/m   Constitutional:  Generally well appearing thin female in no acute distress. Psychiatric: Pleasant. Normal mood and affect. Behavior is normal. EENT: Pupils normal.  Conjunctivae are normal. No scleral icterus. Neck supple.  Cardiovascular: Normal rate, regular rhythm. No edema Pulmonary/chest: Effort normal and breath sounds normal. No wheezing, rales or rhonchi. Abdominal: Soft, nondistended, nontender. Bowel sounds active throughout. There are no masses palpable. No hepatomegaly. Neurological: Alert and oriented to person place and time. Skin: Skin is warm and dry. No rashes noted.  Tye Savoy, NP  06/12/2021, 3:01 PM

## 2021-06-12 NOTE — Patient Instructions (Addendum)
We have sent the following medications to your pharmacy for you to pick up at your convenience: Budesonide 9 mg daily. Levsin 1.25 SL tablet every 12 hours as needed for pain.   Continue Budesonide until follow up.   Call with any problems.  If you are age 54 or older, your body mass index should be between 23-30. Your Body mass index is 21.28 kg/m. If this is out of the aforementioned range listed, please consider follow up with your Primary Care Provider.  If you are age 41 or younger, your body mass index should be between 19-25. Your Body mass index is 21.28 kg/m. If this is out of the aformentioned range listed, please consider follow up with your Primary Care Provider.   ________________________________________________________  The [facility] providers would like to encourage you to use [facility] to communicate with providers for non-urgent requests or questions.  Due to long hold times on the telephone, sending your provider a message by [facility] may be a faster and more efficient way to get a response.  Please allow 48 business hours for a response.  Please remember that this is for non-urgent requests.  _______________________________________________________

## 2021-06-13 NOTE — Progress Notes (Signed)
I agree with the above note, plan 

## 2021-08-11 ENCOUNTER — Ambulatory Visit: Payer: BC Managed Care – PPO | Admitting: Gastroenterology

## 2021-10-07 ENCOUNTER — Other Ambulatory Visit: Payer: Self-pay | Admitting: Nurse Practitioner

## 2021-12-04 ENCOUNTER — Other Ambulatory Visit: Payer: Self-pay | Admitting: Obstetrics & Gynecology

## 2021-12-04 DIAGNOSIS — Z1231 Encounter for screening mammogram for malignant neoplasm of breast: Secondary | ICD-10-CM

## 2022-01-12 ENCOUNTER — Ambulatory Visit
Admission: RE | Admit: 2022-01-12 | Discharge: 2022-01-12 | Disposition: A | Discharge status: Home / Self Care | Payer: BC Managed Care – PPO | Source: Ambulatory Visit | Attending: Obstetrics & Gynecology | Admitting: Obstetrics & Gynecology

## 2022-01-12 DIAGNOSIS — Z1231 Encounter for screening mammogram for malignant neoplasm of breast: Secondary | ICD-10-CM

## 2022-02-04 IMAGING — MG DIGITAL DIAGNOSTIC BILAT W/ TOMO W/ CAD
6 of 10 series · 6 of 30 positions shown · non-contrast
Comparison: Prior films

CLINICAL DATA: Palpable lump right breast

EXAM:
DIGITAL DIAGNOSTIC BILATERAL MAMMOGRAM WITH TOMOSYNTHESIS AND CAD;
ULTRASOUND RIGHT BREAST LIMITED
TECHNIQUE: Bilateral digital diagnostic mammography and breast tomosynthesis
was performed. The images were evaluated with computer-aided
detection.; Targeted ultrasound examination of the right breast was
performed

[L CC synth-2D]
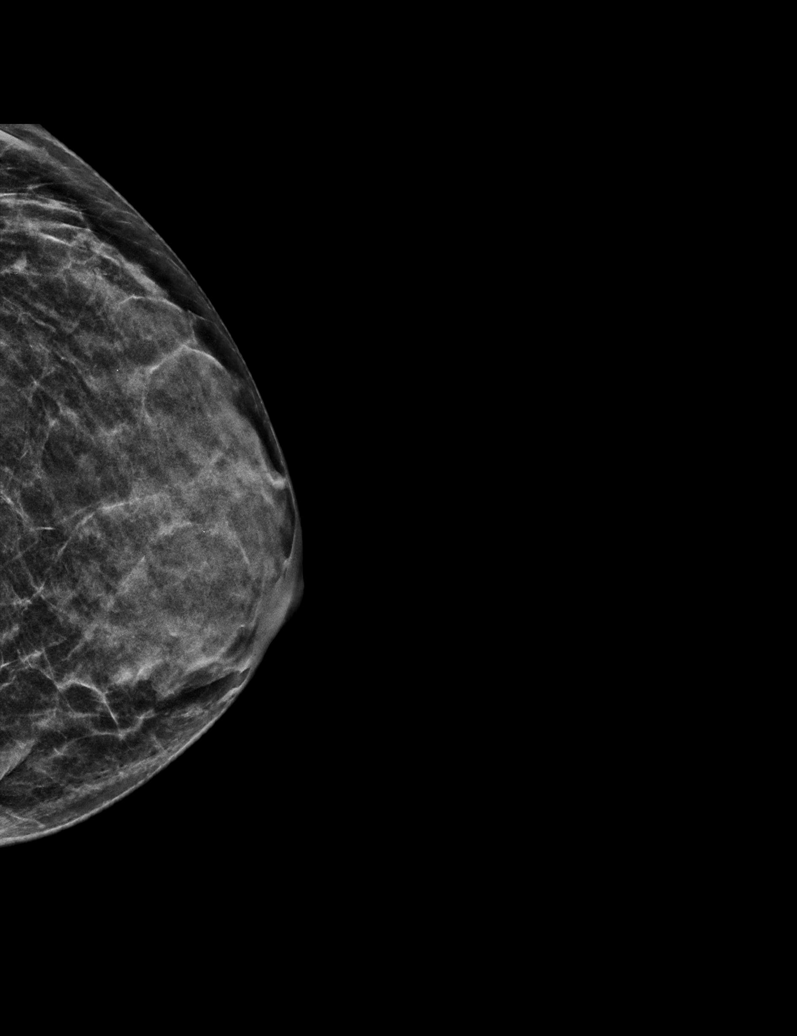

[R MLO synth-2D]
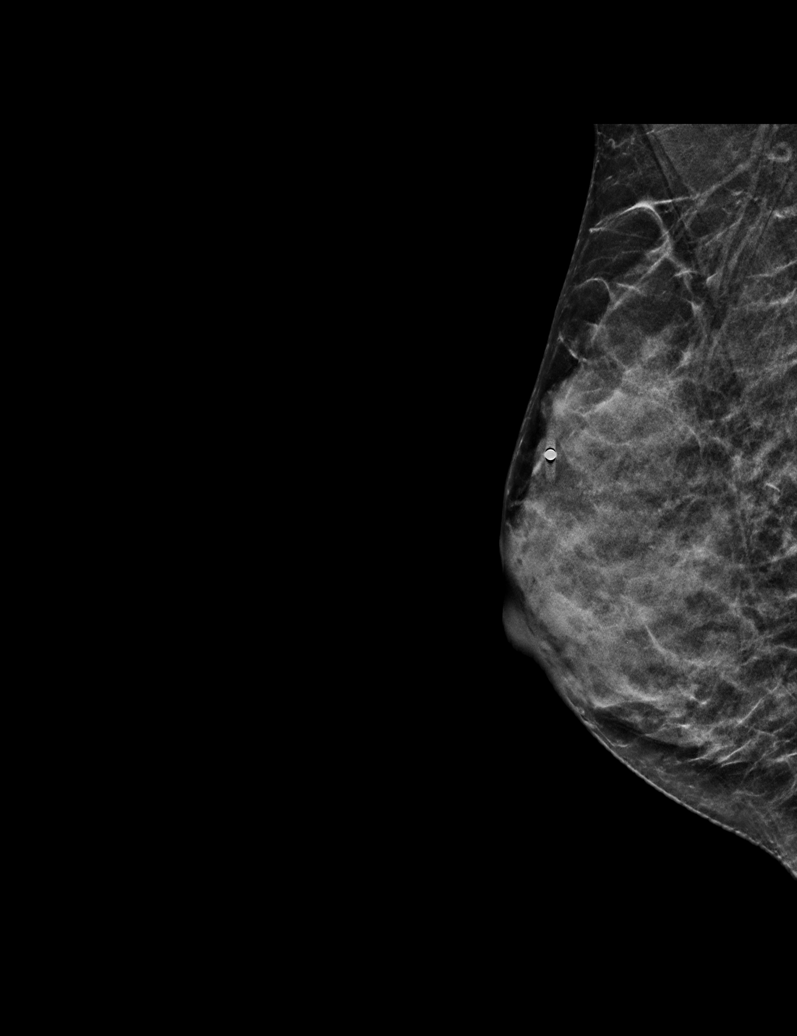

[R CC synth-2D (1 of 2)]
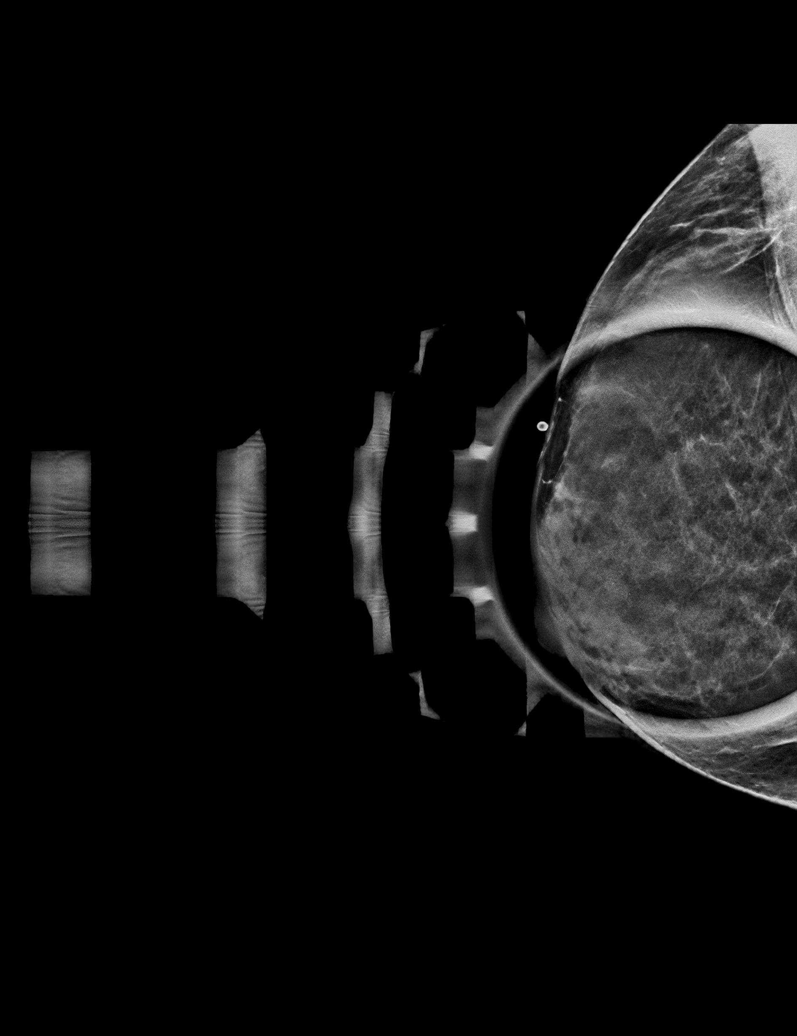

[L MLO synth-2D]
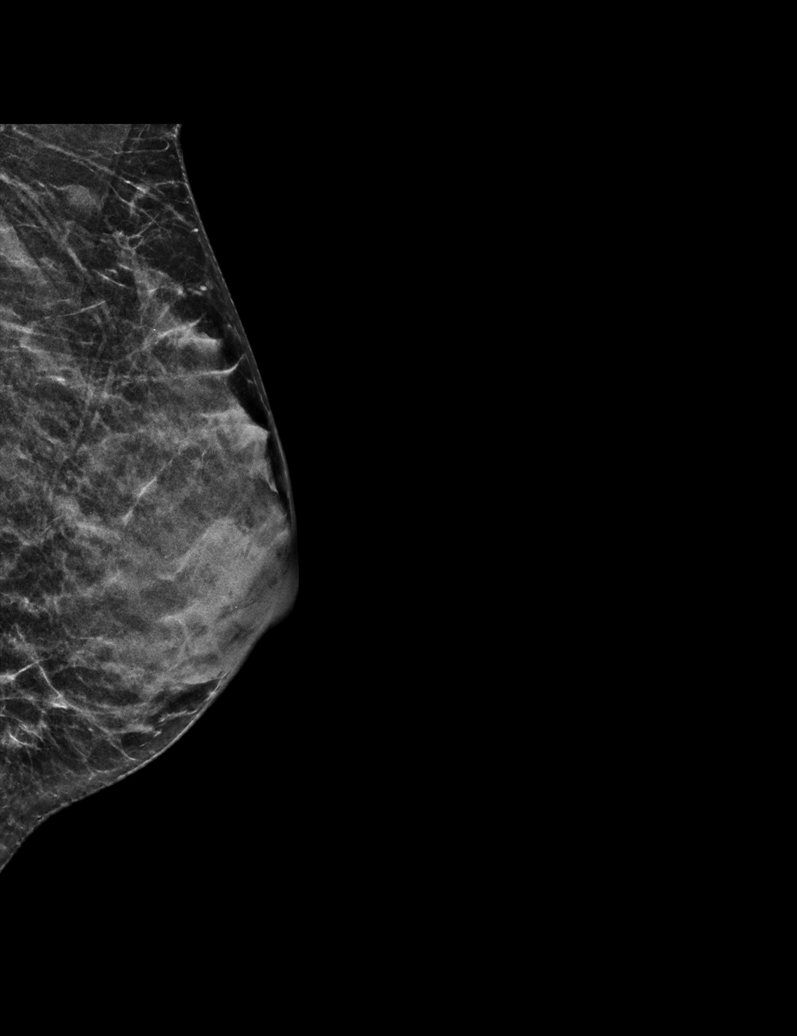

[R CC synth-2D (2 of 2)]
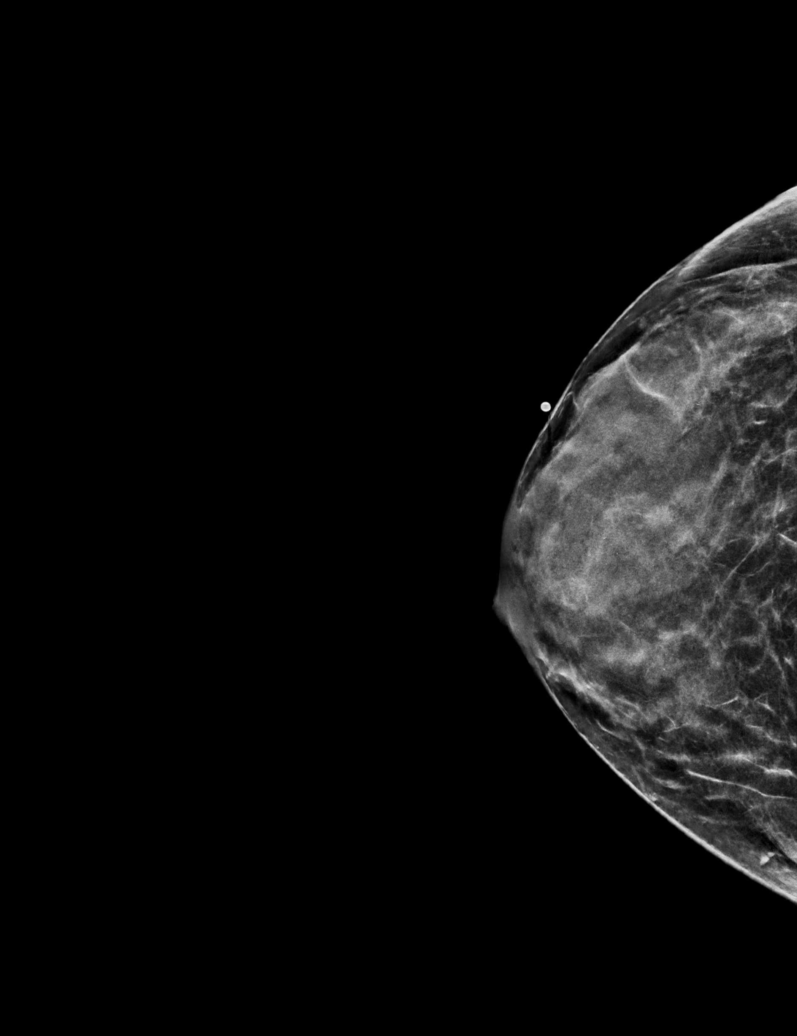

[L CC tomo · tomo slice 20/39.0]
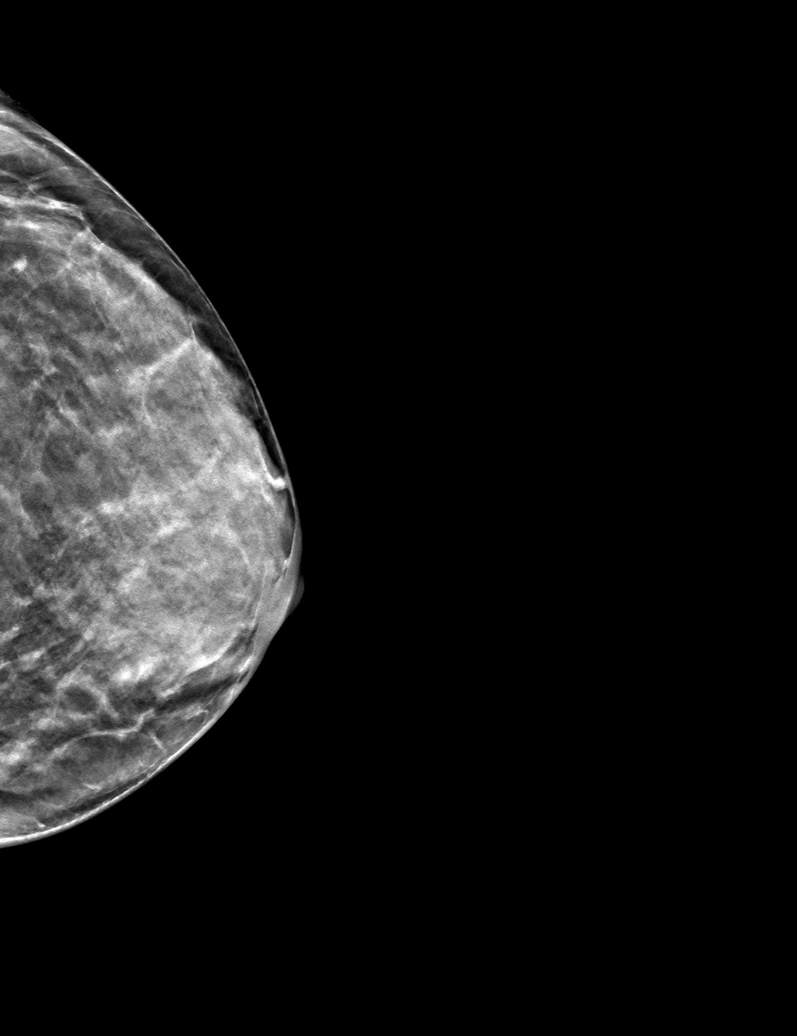

[6 of 30 positions shown; findings below may reference images not displayed]

ACR Breast Density Category d: The breast tissue is extremely dense,
which lowers the sensitivity of mammography.
FINDINGS: Cc and MLO views of bilateral breasts spot tangential view of right
breast are submitted. No suspicious is identified bilaterally.

Targeted ultrasound is performed, showing minimal complicated cyst
with mobile debris and through transmission at right breast 10
o'clock 3 cm nipple palpable area measuring 1 x 1.4 x 1.4 cm.
IMPRESSION: Benign findings.

RECOMMENDATION:
Routine screening mammogram in year.

I have discussed the findings and recommendations with the patient.
If applicable, a reminder letter will be sent to the patient
regarding the next appointment.

BI-RADS CATEGORY  2: Benign.

## 2022-12-14 ENCOUNTER — Other Ambulatory Visit: Payer: Self-pay | Admitting: Obstetrics and Gynecology

## 2022-12-14 DIAGNOSIS — Z1231 Encounter for screening mammogram for malignant neoplasm of breast: Secondary | ICD-10-CM

## 2023-01-14 ENCOUNTER — Ambulatory Visit
Admission: RE | Admit: 2023-01-14 | Discharge: 2023-01-14 | Disposition: A | Discharge status: Home / Self Care | Payer: BC Managed Care – PPO | Source: Ambulatory Visit | Attending: Obstetrics and Gynecology | Admitting: Obstetrics and Gynecology

## 2023-01-14 DIAGNOSIS — Z1231 Encounter for screening mammogram for malignant neoplasm of breast: Secondary | ICD-10-CM

## 2023-12-26 ENCOUNTER — Other Ambulatory Visit: Payer: Self-pay | Admitting: Obstetrics and Gynecology

## 2023-12-26 DIAGNOSIS — Z1231 Encounter for screening mammogram for malignant neoplasm of breast: Secondary | ICD-10-CM

## 2024-01-17 ENCOUNTER — Ambulatory Visit
Admission: RE | Admit: 2024-01-17 | Discharge: 2024-01-17 | Disposition: A | Discharge status: Home / Self Care | Payer: Self-pay | Source: Ambulatory Visit | Attending: Obstetrics and Gynecology | Admitting: Obstetrics and Gynecology

## 2024-01-17 DIAGNOSIS — Z1231 Encounter for screening mammogram for malignant neoplasm of breast: Secondary | ICD-10-CM

## 2024-01-23 ENCOUNTER — Other Ambulatory Visit: Payer: Self-pay | Admitting: Obstetrics and Gynecology

## 2024-01-23 DIAGNOSIS — R928 Other abnormal and inconclusive findings on diagnostic imaging of breast: Secondary | ICD-10-CM

## 2024-02-03 ENCOUNTER — Ambulatory Visit
Admission: RE | Admit: 2024-02-03 | Discharge: 2024-02-03 | Disposition: A | Discharge status: Home / Self Care | Source: Ambulatory Visit | Attending: Obstetrics and Gynecology | Admitting: Obstetrics and Gynecology

## 2024-02-03 DIAGNOSIS — R928 Other abnormal and inconclusive findings on diagnostic imaging of breast: Secondary | ICD-10-CM

## 2024-02-27 ENCOUNTER — Telehealth: Payer: Self-pay | Admitting: Gastroenterology

## 2024-02-27 NOTE — Telephone Encounter (Signed)
 Inbound call from patient stating that she needed a refill for Budesonide . I advised her she needed to make an appointment to be seen due to her last appointment being in 2022. Patient has been scheduled for 10/15 at 10:40 with Jessica Zehr and is requesting we sent refill to [facility] on [address]. Please advise.

## 2024-02-27 NOTE — Telephone Encounter (Signed)
 Called patient to discuss symptoms that she is experiencing. May need to be seen sooner. No answer. Left her a message asking she return my call.

## 2024-03-06 NOTE — Telephone Encounter (Signed)
 Left message on machine to call back

## 2024-03-06 NOTE — Telephone Encounter (Signed)
 Patient returning call stating she has an appt on 10/15 but would like to know if we have something sooner to be seen in office  Requesting a call back  Please advise  Thank you

## 2024-03-07 NOTE — Telephone Encounter (Signed)
 Left message for the patient to call. Can arrange an appointment utilizing a nurse visit.

## 2024-03-07 NOTE — Telephone Encounter (Signed)
 Patient returned your call, please advise.

## 2024-04-18 ENCOUNTER — Encounter: Payer: Self-pay | Admitting: Gastroenterology

## 2024-04-18 ENCOUNTER — Ambulatory Visit: Admitting: Gastroenterology

## 2024-04-18 VITALS — BP 80/56 | HR 63 | Ht 64.0 in | Wt 127.8 lb

## 2024-04-18 DIAGNOSIS — K52832 Lymphocytic colitis: Secondary | ICD-10-CM

## 2024-04-18 DIAGNOSIS — K580 Irritable bowel syndrome with diarrhea: Secondary | ICD-10-CM | POA: Insufficient documentation

## 2024-04-18 DIAGNOSIS — K529 Noninfective gastroenteritis and colitis, unspecified: Secondary | ICD-10-CM

## 2024-04-18 MED ORDER — BUDESONIDE 3 MG PO CPEP: 9.0000 mg | ORAL_CAPSULE | Freq: Every day | ORAL | 1 refills | Status: AC

## 2024-04-18 NOTE — Patient Instructions (Addendum)
 We have sent the following medications to your pharmacy for you to pick up at your convenience: Budesonide  9 mg daily for 8 weeks, them 6 mg daily for 4 weeks, then 3 mg daily for 4 weeks.   _______________________________________________________  If your blood pressure at your visit was 140/90 or greater, please contact your primary care physician to follow up on this.  _______________________________________________________  If you are age 57 or older, your body mass index should be between 23-30. Your Body mass index is 21.94 kg/m. If this is out of the aforementioned range listed, please consider follow up with your Primary Care Provider.  If you are age 28 or younger, your body mass index should be between 19-25. Your Body mass index is 21.94 kg/m. If this is out of the aformentioned range listed, please consider follow up with your Primary Care Provider.   ________________________________________________________  The [facility] providers would like to encourage you to use MYCHART to communicate with providers for non-urgent requests or questions.  Due to long hold times on the telephone, sending your provider a message by [facility] may be a faster and more efficient way to get a response.  Please allow 48 business hours for a response.  Please remember that this is for non-urgent requests.  _______________________________________________________  [facility] is using a team-based approach to care.  Your team is made up of your doctor and two to three APPS. Our APPS (Nurse Practitioners and Physician Assistants) work with your physician to ensure care continuity for you. They are fully qualified to address your health concerns and develop a treatment plan. They communicate directly with your gastroenterologist to care for you. Seeing the Advanced Practice Practitioners on your physician's team can help you by facilitating care more promptly, often allowing for earlier  appointments, access to diagnostic testing, procedures, and other specialty referrals.

## 2024-04-18 NOTE — Progress Notes (Signed)
 04/18/2024 Kelly Bullock [id number] 1966/06/11   Discussed the use of AI scribe software for clinical note transcription with the patient, who gave verbal consent to proceed.  History of Present Illness Kelly Bullock is a 58 year old female with lymphocytic colitis who presents for medication management.  Previously a patient of Dr. Teressa, her care is being assigned to Dr. Legrand.  She experiences significant gastrointestinal symptoms without medication, including stomach cramping, explosive diarrhea, and bloating. Stress, alcohol, caffeine, and acidic foods exacerbate her symptoms, while a gluten-free diet provides some relief. She has been using budesonide  since 2022, most recently as needed, which effectively manages her symptoms but causes sleep disturbances. She uses the medication as needed to prevent severe episodes from occurring.  On Monday night, she experienced sudden stomach cramping and diarrhea, necessitating four to five bathroom visits within an hour. Without medication, these episodes result in sleepless nights and prolonged diarrhea. She feels 'empty' after such episodes, which allows her to sleep, but she remains anxious about potential accidents at work or in public.  Her symptoms have persisted since age 37, with a history of hospital visits and extensive testing. She has tried other medications, such as Imodium and Lotronex, which provided some relief.  Her family history is significant for gastrointestinal disorders, including a sister with Crohn's disease and multiple relatives with gluten allergies or celiac disease. Her mother also has lymphocytic colitis. Although she has not been formally tested for celiac disease, she maintains a gluten-free diet due to symptom relief.    Colonoscopy 01/2019:  - The examined portion of the ileum was normal. - The entire examined colon is normal on direct and retroflexion views. - Biopsies were taken with a cold forceps  from the entire colon for evaluation of microscopic colitis.  Surgical [P], random colon sites - CHRONIC LYMPHOCYTIC COLITIS. SEE NOTE  10 year screening recall   Past Medical History:  Diagnosis Date   Allergy    Asthma    Breast mass 10/12/2016   bilateral lumps felt by Dr    Past Surgical History:  Procedure Laterality Date   CESAREAN SECTION     x2   COLONOSCOPY  2004   UPPER GASTROINTESTINAL ENDOSCOPY  2008    reports that she has never smoked. She has never used smokeless tobacco. She reports current alcohol use of about 2.0 standard drinks of alcohol per week. She reports that she does not use drugs. family history includes Breast cancer (age of onset: 67) in her mother; Colon cancer in her maternal grandmother and paternal grandmother; Diabetes in her paternal grandmother; Heart disease in her maternal grandfather; Hyperlipidemia in her mother; Leukemia in her paternal grandfather; Lung cancer in her maternal grandfather. Allergies  Allergen Reactions   Latex Swelling and Other (See Comments)    Redness to skin      Outpatient Encounter Medications as of 04/18/2024  Medication Sig   budesonide  (ENTOCORT EC ) 3 MG 24 hr capsule TAKE 3 CAPSULES (9 MG TOTAL) BY MOUTH DAILY.   cetirizine (ZYRTEC) 10 MG tablet Take 10 mg by mouth daily.   cetirizine (ZYRTEC) 10 MG tablet Take 1 tablet by mouth daily.   estradiol (CLIMARA - DOSED IN MG/24 HR) 0.05 mg/24hr patch Place 0.05 mg onto the skin once a week.   estradiol (CLIMARA - DOSED IN MG/24 HR) 0.05 mg/24hr patch Estradiol   fluticasone (FLONASE) 50 MCG/ACT nasal spray Place into both nostrils daily.   Fluticasone Propionate (FLONASE NA)  Flonase   hyoscyamine  (LEVSIN  SL) 0.125 MG SL tablet Place 1 tablet (0.125 mg total) under the tongue every 12 (twelve) hours as needed.   prednisoLONE 5 MG TABS tablet prednisoLONE   progesterone (PROMETRIUM) 200 MG capsule Take 200 mg by mouth at bedtime.   UNABLE TO FIND Med Name: Adell  for menopause   No facility-administered encounter medications on file as of 04/18/2024.    REVIEW OF SYSTEMS  : All other systems reviewed and negative except where noted in the History of Present Illness.   PHYSICAL EXAM: BP (!) 80/56   Pulse 63   Ht 5' 4 (1.626 m)   Wt 127 lb 12.8 oz (58 kg)   BMI 21.94 kg/m  General: Well developed white female in no acute distress Head: Normocephalic and atraumatic Eyes:  Sclerae anicteric, conjunctiva pink. Ears: Normal auditory acuity Lungs: Clear throughout to auscultation; no W/R/R. Heart: Regular rate and rhythm; no M/R/G. Abdomen: Soft, non-distended.  BS present.  Minimal diffuse TTP. Musculoskeletal: Symmetrical with no gross deformities  Skin: No lesions on visible extremities Extremities: No edema  Neurological: Alert oriented x 4, grossly non-focal Psychological:  Alert and cooperative. Normal mood and affect Assessment & Plan Lymphocytic colitis Chronic lymphocytic colitis with explosive diarrhea, abdominal cramping, and bloating, exacerbated by stress, alcohol, caffeine, and acidic foods. Budesonide  is effective in managing symptoms, though it causes sleep disturbances. Potential overlap with irritable bowel syndrome (IBS) is considered due to the long history of symptoms since adolescence. Budesonide  is preferred for its efficacy in reducing colonic inflammation and improving stool consistency. Risks of long-term steroid use, including effects on bone density, were discussed, noting budesonide 's lower systemic absorption compared to prednisone. Three response categories to budesonide  were discussed: complete remission after tapering, need for ongoing low-dose maintenance, and non-response requiring alternative treatments. - Prescribe budesonide  9 mg daily for 8 weeks, taper to 6 mg daily for 4 weeks, then to 3 mg daily for 4 weeks. - Schedule follow-up appointment at 12 to 10 weeks to assess response to treatment. - Monitor bone  density regularly due to potential effects of long-term steroid use, which she does with GYN or PCP. - We discussed potential for repeating colonoscopy as well to confirm if lymphocytic colitis is still present or if the primary diagnosis would be IBS-D currently.  She would like to go on the budesonide  for now and see how she does.  Irritable bowel syndrome with diarrhea Chronic irritable bowel syndrome with diarrhea, likely overlapping with lymphocytic colitis. Symptoms include diarrhea, abdominal pain, and bloating, exacerbated by stress and anxiety. Anxiety about symptoms may exacerbate IBS. Previous use of Lotronex was effective, and Viberzi is another potential option. Imodium is discussed for symptomatic management of diarrhea. Distinguishing between symptoms caused by lymphocytic colitis and IBS is challenging given the long history of symptoms since adolescence. - Consider trial of Lotronex or Viberzi if symptoms persist despite budesonide  treatment. - Use Imodium as needed for symptomatic management of diarrhea.     CC:  No ref. provider found

## 2024-04-19 NOTE — Progress Notes (Signed)
 ____________________________________________________________  Attending physician addendum:  Thank you for sending this case to me. I have reviewed the entire note and agree with the plan.  If she does not improve with budesonide , then I agree she should have a colonoscopy to see if lymphocytic colitis is still present or perhaps progressed to collagenous colitis.  In that instance, empiric therapy for SIBO would also be reasonable while waiting for the colonoscopy.  Victory Brand, MD  ____________________________________________________________
